# Patient Record
Sex: Female | Born: 2000 | Race: Black or African American | Hispanic: No | Marital: Single | Smoking: Never smoker
Health system: Southern US, Community
[De-identification: ages and names within clinical notes are randomized; demographics above are authoritative.]

## PROBLEM LIST (undated history)

## (undated) DIAGNOSIS — Z789 Other specified health status: Secondary | ICD-10-CM

## (undated) DIAGNOSIS — L309 Dermatitis, unspecified: Secondary | ICD-10-CM

## (undated) HISTORY — PX: HERNIA REPAIR: SHX51

---
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
## 2020-09-30 ENCOUNTER — Inpatient Hospital Stay (HOSPITAL_COMMUNITY): Payer: Medicaid Other | Admitting: Certified Registered"

## 2020-09-30 ENCOUNTER — Emergency Department (HOSPITAL_COMMUNITY): Payer: Medicaid Other

## 2020-09-30 ENCOUNTER — Inpatient Hospital Stay (HOSPITAL_COMMUNITY)
Admission: EM | Admit: 2020-09-30 | Discharge: 2020-10-03 | DRG: 473 | Disposition: A | Discharge status: Home / Self Care | Payer: Medicaid Other | Attending: Neurosurgery | Admitting: Neurosurgery

## 2020-09-30 ENCOUNTER — Encounter (HOSPITAL_COMMUNITY)
Admission: EM | Disposition: A | Discharge status: Home / Self Care | Payer: Self-pay | Source: Home / Self Care | Attending: Neurological Surgery

## 2020-09-30 ENCOUNTER — Inpatient Hospital Stay (HOSPITAL_COMMUNITY): Payer: Medicaid Other

## 2020-09-30 ENCOUNTER — Encounter (HOSPITAL_COMMUNITY): Payer: Self-pay | Admitting: Neurological Surgery

## 2020-09-30 ENCOUNTER — Other Ambulatory Visit: Payer: Self-pay

## 2020-09-30 DIAGNOSIS — Y92410 Unspecified street and highway as the place of occurrence of the external cause: Secondary | ICD-10-CM | POA: Diagnosis not present

## 2020-09-30 DIAGNOSIS — Z20822 Contact with and (suspected) exposure to covid-19: Secondary | ICD-10-CM | POA: Diagnosis present

## 2020-09-30 DIAGNOSIS — S12490A Other displaced fracture of fifth cervical vertebra, initial encounter for closed fracture: Secondary | ICD-10-CM

## 2020-09-30 DIAGNOSIS — R52 Pain, unspecified: Secondary | ICD-10-CM

## 2020-09-30 DIAGNOSIS — S129XXA Fracture of neck, unspecified, initial encounter: Secondary | ICD-10-CM | POA: Diagnosis present

## 2020-09-30 DIAGNOSIS — T1490XA Injury, unspecified, initial encounter: Secondary | ICD-10-CM

## 2020-09-30 DIAGNOSIS — Z23 Encounter for immunization: Secondary | ICD-10-CM | POA: Diagnosis not present

## 2020-09-30 DIAGNOSIS — S12390A Other displaced fracture of fourth cervical vertebra, initial encounter for closed fracture: Secondary | ICD-10-CM

## 2020-09-30 DIAGNOSIS — Z419 Encounter for procedure for purposes other than remedying health state, unspecified: Secondary | ICD-10-CM

## 2020-09-30 DIAGNOSIS — L309 Dermatitis, unspecified: Secondary | ICD-10-CM | POA: Diagnosis present

## 2020-09-30 DIAGNOSIS — M542 Cervicalgia: Secondary | ICD-10-CM | POA: Diagnosis present

## 2020-09-30 DIAGNOSIS — S12400A Unspecified displaced fracture of fifth cervical vertebra, initial encounter for closed fracture: Principal | ICD-10-CM | POA: Diagnosis present

## 2020-09-30 HISTORY — PX: ANTERIOR CERVICAL DECOMP/DISCECTOMY FUSION: SHX1161

## 2020-09-30 LAB — CBC
HCT: 40.7 % (ref 36.0–46.0)
Hemoglobin: 13.4 g/dL (ref 12.0–15.0)
MCH: 27 pg (ref 26.0–34.0)
MCHC: 32.9 g/dL (ref 30.0–36.0)
MCV: 81.9 fL (ref 80.0–100.0)
Platelets: 242 10*3/uL (ref 150–400)
RBC: 4.97 MIL/uL (ref 3.87–5.11)
RDW: 13.1 % (ref 11.5–15.5)
WBC: 8.7 10*3/uL (ref 4.0–10.5)
nRBC: 0 % (ref 0.0–0.2)

## 2020-09-30 LAB — COMPREHENSIVE METABOLIC PANEL
ALT: 12 U/L (ref 0–44)
AST: 20 U/L (ref 15–41)
Albumin: 4.4 g/dL (ref 3.5–5.0)
Alkaline Phosphatase: 50 U/L (ref 38–126)
Anion gap: 10 (ref 5–15)
BUN: <5 mg/dL — ABNORMAL LOW (ref 6–20)
CO2: 23 mmol/L (ref 22–32)
Calcium: 9.5 mg/dL (ref 8.9–10.3)
Chloride: 106 mmol/L (ref 98–111)
Creatinine, Ser: 0.88 mg/dL (ref 0.44–1.00)
GFR, Estimated: >60 mL/min (ref >60)
Glucose, Bld: 93 mg/dL (ref 70–99)
Potassium: 3.7 mmol/L (ref 3.5–5.1)
Sodium: 139 mmol/L (ref 135–145)
Total Bilirubin: 0.5 mg/dL (ref 0.3–1.2)
Total Protein: 7.1 g/dL (ref 6.5–8.1)

## 2020-09-30 LAB — I-STAT CHEM 8, ED
BUN: 4 mg/dL — ABNORMAL LOW (ref 6–20)
Calcium, Ion: 1.18 mmol/L (ref 1.15–1.40)
Chloride: 107 mmol/L (ref 98–111)
Creatinine, Ser: 0.9 mg/dL (ref 0.44–1.00)
Glucose, Bld: 90 mg/dL (ref 70–99)
HCT: 40 % (ref 36.0–46.0)
Hemoglobin: 13.6 g/dL (ref 12.0–15.0)
Potassium: 3.5 mmol/L (ref 3.5–5.1)
Sodium: 141 mmol/L (ref 135–145)
TCO2: 23 mmol/L (ref 22–32)

## 2020-09-30 LAB — I-STAT BETA HCG BLOOD, ED (MC, WL, AP ONLY): I-stat hCG, quantitative: <5 m[IU]/mL (ref <5)

## 2020-09-30 LAB — SAMPLE TO BLOOD BANK

## 2020-09-30 LAB — ETHANOL: Alcohol, Ethyl (B): 82 mg/dL — ABNORMAL HIGH (ref <10)

## 2020-09-30 LAB — PROTIME-INR
INR: 1.1 (ref 0.8–1.2)
Prothrombin Time: 13.8 seconds (ref 11.4–15.2)

## 2020-09-30 LAB — LACTIC ACID, PLASMA: Lactic Acid, Venous: 3 mmol/L (ref 0.5–1.9)

## 2020-09-30 LAB — SARS CORONAVIRUS 2 (TAT 6-24 HRS): SARS Coronavirus 2: NEGATIVE

## 2020-09-30 SURGERY — ANTERIOR CERVICAL DECOMPRESSION/DISCECTOMY FUSION 3 LEVELS
Anesthesia: General | Site: Spine Cervical

## 2020-09-30 MED ORDER — 0.9 % SODIUM CHLORIDE (POUR BTL) OPTIME
TOPICAL | Status: DC | PRN
  Administered 2020-09-30: 1000 mL

## 2020-09-30 MED ORDER — MIDAZOLAM HCL 2 MG/2ML IJ SOLN
INTRAMUSCULAR | Status: DC | PRN
  Administered 2020-09-30: 2 mg via INTRAVENOUS

## 2020-09-30 MED ORDER — FENTANYL CITRATE (PF) 100 MCG/2ML IJ SOLN
INTRAMUSCULAR | Status: AC
  Filled 2020-09-30: qty 2

## 2020-09-30 MED ORDER — PHENOL 1.4 % MT LIQD: 1.0000 | OROMUCOSAL | Status: DC | PRN

## 2020-09-30 MED ORDER — ACETAMINOPHEN 10 MG/ML IV SOLN
INTRAVENOUS | Status: AC
  Filled 2020-09-30: qty 100

## 2020-09-30 MED ORDER — HYDROMORPHONE HCL 1 MG/ML IJ SOLN: 1.0000 mg | INTRAMUSCULAR | Status: DC | PRN

## 2020-09-30 MED ORDER — MUPIROCIN 2 % EX OINT: 1.0000 "application " | TOPICAL_OINTMENT | Freq: Two times a day (BID) | CUTANEOUS | Status: DC

## 2020-09-30 MED ORDER — THROMBIN 5000 UNITS EX SOLR
OROMUCOSAL | Status: DC | PRN
  Administered 2020-09-30: 5 mL via TOPICAL

## 2020-09-30 MED ORDER — SODIUM CHLORIDE 0.9 % IV SOLN
250.0000 mL | INTRAVENOUS | Status: DC
  Administered 2020-09-30: 250 mL via INTRAVENOUS

## 2020-09-30 MED ORDER — ENSURE ENLIVE PO LIQD: 237.0000 mL | Freq: Two times a day (BID) | ORAL | Status: DC

## 2020-09-30 MED ORDER — POLYETHYLENE GLYCOL 3350 17 G PO PACK: 17.0000 g | PACK | Freq: Every day | ORAL | Status: DC | PRN

## 2020-09-30 MED ORDER — ONDANSETRON HCL 4 MG/2ML IJ SOLN: 4.0000 mg | Freq: Four times a day (QID) | INTRAMUSCULAR | Status: DC | PRN

## 2020-09-30 MED ORDER — FENTANYL CITRATE (PF) 100 MCG/2ML IJ SOLN
50.0000 ug | Freq: Once | INTRAMUSCULAR | Status: AC
Start: 2020-09-30 — End: 2020-09-30
  Administered 2020-09-30: 50 ug via INTRAVENOUS
  Filled 2020-09-30: qty 2

## 2020-09-30 MED ORDER — LIDOCAINE-EPINEPHRINE 1 %-1:100000 IJ SOLN
INTRAMUSCULAR | Status: DC | PRN
  Administered 2020-09-30: 6 mL

## 2020-09-30 MED ORDER — LIDOCAINE 2% (20 MG/ML) 5 ML SYRINGE
INTRAMUSCULAR | Status: AC
  Filled 2020-09-30: qty 5

## 2020-09-30 MED ORDER — SODIUM CHLORIDE 0.9 % IV BOLUS
500.0000 mL | Freq: Once | INTRAVENOUS | Status: AC
  Administered 2020-09-30: 500 mL via INTRAVENOUS

## 2020-09-30 MED ORDER — SODIUM CHLORIDE 0.9 % IV SOLN: INTRAVENOUS | Status: DC

## 2020-09-30 MED ORDER — SUCCINYLCHOLINE CHLORIDE 200 MG/10ML IV SOSY
PREFILLED_SYRINGE | INTRAVENOUS | Status: AC
  Filled 2020-09-30: qty 10

## 2020-09-30 MED ORDER — ONDANSETRON HCL 4 MG/2ML IJ SOLN
INTRAMUSCULAR | Status: AC
  Filled 2020-09-30: qty 2

## 2020-09-30 MED ORDER — LIDOCAINE 2% (20 MG/ML) 5 ML SYRINGE
INTRAMUSCULAR | Status: DC | PRN
  Administered 2020-09-30: 60 mg via INTRAVENOUS

## 2020-09-30 MED ORDER — SODIUM CHLORIDE 0.9 % IV BOLUS
1000.0000 mL | Freq: Once | INTRAVENOUS | Status: AC
  Administered 2020-09-30: 1000 mL via INTRAVENOUS

## 2020-09-30 MED ORDER — PROPOFOL 10 MG/ML IV BOLUS
INTRAVENOUS | Status: DC | PRN
  Administered 2020-09-30: 140 mg via INTRAVENOUS

## 2020-09-30 MED ORDER — ONDANSETRON HCL 4 MG/2ML IJ SOLN
INTRAMUSCULAR | Status: DC | PRN
  Administered 2020-09-30: 4 mg via INTRAVENOUS

## 2020-09-30 MED ORDER — OXYCODONE HCL 5 MG PO TABS: 10.0000 mg | ORAL_TABLET | ORAL | Status: DC | PRN

## 2020-09-30 MED ORDER — OXYCODONE HCL 5 MG PO TABS: 5.0000 mg | ORAL_TABLET | ORAL | Status: DC | PRN

## 2020-09-30 MED ORDER — FENTANYL CITRATE (PF) 100 MCG/2ML IJ SOLN
50.0000 ug | Freq: Once | INTRAMUSCULAR | Status: AC
  Administered 2020-09-30: 50 ug via INTRAVENOUS
  Filled 2020-09-30: qty 2

## 2020-09-30 MED ORDER — SUCCINYLCHOLINE CHLORIDE 20 MG/ML IJ SOLN
INTRAMUSCULAR | Status: DC | PRN
  Administered 2020-09-30: 100 mg via INTRAVENOUS

## 2020-09-30 MED ORDER — HYDROMORPHONE HCL 1 MG/ML IJ SOLN
1.0000 mg | INTRAMUSCULAR | Status: DC | PRN
  Administered 2020-10-01 (×2): 1 mg via INTRAVENOUS
  Filled 2020-09-30 (×3): qty 1

## 2020-09-30 MED ORDER — FENTANYL CITRATE (PF) 250 MCG/5ML IJ SOLN
INTRAMUSCULAR | Status: AC
  Filled 2020-09-30: qty 5

## 2020-09-30 MED ORDER — DOCUSATE SODIUM 100 MG PO CAPS: 100.0000 mg | ORAL_CAPSULE | Freq: Two times a day (BID) | ORAL | Status: DC

## 2020-09-30 MED ORDER — FENTANYL CITRATE (PF) 100 MCG/2ML IJ SOLN
25.0000 ug | INTRAMUSCULAR | Status: DC | PRN
  Administered 2020-09-30 (×2): 50 ug via INTRAVENOUS

## 2020-09-30 MED ORDER — MENTHOL 3 MG MT LOZG: 1.0000 | LOZENGE | OROMUCOSAL | Status: DC | PRN

## 2020-09-30 MED ORDER — PROPOFOL 10 MG/ML IV BOLUS
INTRAVENOUS | Status: AC
  Filled 2020-09-30: qty 20

## 2020-09-30 MED ORDER — CEFAZOLIN SODIUM-DEXTROSE 2-3 GM-%(50ML) IV SOLR
INTRAVENOUS | Status: DC | PRN
  Administered 2020-09-30: 2 g via INTRAVENOUS

## 2020-09-30 MED ORDER — CEFAZOLIN SODIUM-DEXTROSE 2-4 GM/100ML-% IV SOLN
2.0000 g | Freq: Three times a day (TID) | INTRAVENOUS | Status: AC
  Administered 2020-09-30 – 2020-10-01 (×2): 2 g via INTRAVENOUS
  Filled 2020-09-30 (×2): qty 100

## 2020-09-30 MED ORDER — LACTATED RINGERS IV SOLN: INTRAVENOUS | Status: DC | PRN

## 2020-09-30 MED ORDER — TETANUS-DIPHTH-ACELL PERTUSSIS 5-2.5-18.5 LF-MCG/0.5 IM SUSY
0.5000 mL | PREFILLED_SYRINGE | Freq: Once | INTRAMUSCULAR | Status: AC
  Administered 2020-09-30: 0.5 mL via INTRAMUSCULAR
  Filled 2020-09-30: qty 0.5

## 2020-09-30 MED ORDER — ACETAMINOPHEN 650 MG RE SUPP: 650.0000 mg | RECTAL | Status: DC | PRN

## 2020-09-30 MED ORDER — ACETAMINOPHEN 325 MG PO TABS
650.0000 mg | ORAL_TABLET | ORAL | Status: DC | PRN
  Administered 2020-09-30 – 2020-10-03 (×2): 650 mg via ORAL
  Filled 2020-09-30 (×2): qty 2

## 2020-09-30 MED ORDER — ACETAMINOPHEN 10 MG/ML IV SOLN
INTRAVENOUS | Status: DC | PRN
  Administered 2020-09-30: 1000 mg via INTRAVENOUS

## 2020-09-30 MED ORDER — OXYCODONE HCL 5 MG PO TABS
10.0000 mg | ORAL_TABLET | ORAL | Status: DC | PRN
  Administered 2020-09-30 – 2020-10-03 (×12): 10 mg via ORAL
  Filled 2020-09-30 (×12): qty 2

## 2020-09-30 MED ORDER — OXYCODONE HCL 5 MG PO TABS
5.0000 mg | ORAL_TABLET | ORAL | Status: DC | PRN
  Administered 2020-09-30: 5 mg via ORAL
  Filled 2020-09-30: qty 1

## 2020-09-30 MED ORDER — ACETAMINOPHEN 325 MG PO TABS: 650.0000 mg | ORAL_TABLET | ORAL | Status: DC | PRN

## 2020-09-30 MED ORDER — THROMBIN 5000 UNITS EX SOLR
CUTANEOUS | Status: AC
  Filled 2020-09-30: qty 5000

## 2020-09-30 MED ORDER — FENTANYL CITRATE (PF) 100 MCG/2ML IJ SOLN
INTRAMUSCULAR | Status: DC | PRN
  Administered 2020-09-30 (×2): 50 ug via INTRAVENOUS

## 2020-09-30 MED ORDER — ONDANSETRON HCL 4 MG PO TABS: 4.0000 mg | ORAL_TABLET | Freq: Four times a day (QID) | ORAL | Status: DC | PRN

## 2020-09-30 MED ORDER — MIDAZOLAM HCL 2 MG/2ML IJ SOLN
INTRAMUSCULAR | Status: AC
  Filled 2020-09-30: qty 2

## 2020-09-30 MED ORDER — LIDOCAINE-EPINEPHRINE 1 %-1:100000 IJ SOLN
INTRAMUSCULAR | Status: AC
  Filled 2020-09-30: qty 1

## 2020-09-30 MED ORDER — SODIUM CHLORIDE 0.9 % IV SOLN
INTRAVENOUS | Status: DC
  Filled 2020-09-30: qty 30

## 2020-09-30 MED ORDER — DEXAMETHASONE SODIUM PHOSPHATE 10 MG/ML IJ SOLN
INTRAMUSCULAR | Status: AC
  Filled 2020-09-30: qty 1

## 2020-09-30 MED ORDER — SODIUM CHLORIDE 0.9% FLUSH
3.0000 mL | Freq: Two times a day (BID) | INTRAVENOUS | Status: DC
  Administered 2020-09-30 – 2020-10-02 (×6): 3 mL via INTRAVENOUS

## 2020-09-30 MED ORDER — ROCURONIUM BROMIDE 10 MG/ML (PF) SYRINGE
PREFILLED_SYRINGE | INTRAVENOUS | Status: DC | PRN
  Administered 2020-09-30: 10 mg via INTRAVENOUS
  Administered 2020-09-30: 40 mg via INTRAVENOUS
  Administered 2020-09-30: 10 mg via INTRAVENOUS

## 2020-09-30 MED ORDER — SUGAMMADEX SODIUM 200 MG/2ML IV SOLN
INTRAVENOUS | Status: DC | PRN
  Administered 2020-09-30 (×2): 100 mg via INTRAVENOUS

## 2020-09-30 MED ORDER — PROMETHAZINE HCL 25 MG/ML IJ SOLN: 6.2500 mg | INTRAMUSCULAR | Status: DC | PRN

## 2020-09-30 MED ORDER — DOCUSATE SODIUM 100 MG PO CAPS
100.0000 mg | ORAL_CAPSULE | Freq: Two times a day (BID) | ORAL | Status: DC
  Administered 2020-09-30 – 2020-10-02 (×5): 100 mg via ORAL
  Filled 2020-09-30 (×5): qty 1

## 2020-09-30 MED ORDER — ROCURONIUM BROMIDE 10 MG/ML (PF) SYRINGE
PREFILLED_SYRINGE | INTRAVENOUS | Status: AC
  Filled 2020-09-30: qty 10

## 2020-09-30 MED ORDER — DEXAMETHASONE SODIUM PHOSPHATE 10 MG/ML IJ SOLN
INTRAMUSCULAR | Status: DC | PRN
  Administered 2020-09-30: 10 mg via INTRAVENOUS

## 2020-09-30 MED ORDER — SODIUM CHLORIDE 0.9% FLUSH: 3.0000 mL | INTRAVENOUS | Status: DC | PRN

## 2020-09-30 MED ORDER — CEFAZOLIN SODIUM 1 G IJ SOLR
INTRAMUSCULAR | Status: AC
  Filled 2020-09-30: qty 20

## 2020-09-30 MED ORDER — CYCLOBENZAPRINE HCL 10 MG PO TABS
10.0000 mg | ORAL_TABLET | Freq: Three times a day (TID) | ORAL | Status: DC | PRN
  Administered 2020-09-30 – 2020-10-02 (×4): 10 mg via ORAL
  Filled 2020-09-30 (×6): qty 1

## 2020-09-30 SURGICAL SUPPLY — 56 items
ADH SKN CLS APL DERMABOND .7 (GAUZE/BANDAGES/DRESSINGS) ×1
APL SKNCLS STERI-STRIP NONHPOA (GAUZE/BANDAGES/DRESSINGS)
BAND INSRT 18 STRL LF DISP RB (MISCELLANEOUS) ×2
BAND RUBBER #18 3X1/16 STRL (MISCELLANEOUS) ×6 IMPLANT
BENZOIN TINCTURE PRP APPL 2/3 (GAUZE/BANDAGES/DRESSINGS) IMPLANT
BLADE CLIPPER SURG (BLADE) IMPLANT
BLADE SURG 11 STRL SS (BLADE) ×3 IMPLANT
BUR MATCHSTICK NEURO 3.0 LAGG (BURR) ×3 IMPLANT
CANISTER SUCT 3000ML PPV (MISCELLANEOUS) ×3 IMPLANT
COVER WAND RF STERILE (DRAPES) ×3 IMPLANT
DECANTER SPIKE VIAL GLASS SM (MISCELLANEOUS) ×3 IMPLANT
DERMABOND ADVANCED (GAUZE/BANDAGES/DRESSINGS) ×2
DERMABOND ADVANCED .7 DNX12 (GAUZE/BANDAGES/DRESSINGS) ×1 IMPLANT
DRAPE C-ARM 42X72 X-RAY (DRAPES) ×6 IMPLANT
DRAPE HALF SHEET 40X57 (DRAPES) IMPLANT
DRAPE LAPAROTOMY 100X72 PEDS (DRAPES) ×3 IMPLANT
DRAPE MICROSCOPE LEICA (MISCELLANEOUS) ×1 IMPLANT
DURAPREP 6ML APPLICATOR 50/CS (WOUND CARE) ×3 IMPLANT
ELECT COATED BLADE 2.86 ST (ELECTRODE) ×3 IMPLANT
ELECT REM PT RETURN 9FT ADLT (ELECTROSURGICAL) ×3
ELECTRODE REM PT RTRN 9FT ADLT (ELECTROSURGICAL) ×1 IMPLANT
GAUZE 4X4 16PLY RFD (DISPOSABLE) IMPLANT
GLOVE BIOGEL PI IND STRL 7.5 (GLOVE) ×2 IMPLANT
GLOVE BIOGEL PI INDICATOR 7.5 (GLOVE) ×4
GLOVE ECLIPSE 7.5 STRL STRAW (GLOVE) ×3 IMPLANT
GLOVE EXAM NITRILE LRG STRL (GLOVE) IMPLANT
GLOVE EXAM NITRILE XL STR (GLOVE) IMPLANT
GLOVE EXAM NITRILE XS STR PU (GLOVE) IMPLANT
GOWN STRL REUS W/ TWL LRG LVL3 (GOWN DISPOSABLE) ×2 IMPLANT
GOWN STRL REUS W/ TWL XL LVL3 (GOWN DISPOSABLE) IMPLANT
GOWN STRL REUS W/TWL 2XL LVL3 (GOWN DISPOSABLE) IMPLANT
GOWN STRL REUS W/TWL LRG LVL3 (GOWN DISPOSABLE) ×6
GOWN STRL REUS W/TWL XL LVL3 (GOWN DISPOSABLE)
HEMOSTAT POWDER KIT SURGIFOAM (HEMOSTASIS) ×3 IMPLANT
KIT BASIN OR (CUSTOM PROCEDURE TRAY) ×3 IMPLANT
KIT TURNOVER KIT B (KITS) ×3 IMPLANT
NDL SPNL 18GX3.5 QUINCKE PK (NEEDLE) ×1 IMPLANT
NEEDLE HYPO 22GX1.5 SAFETY (NEEDLE) ×3 IMPLANT
NEEDLE SPNL 18GX3.5 QUINCKE PK (NEEDLE) ×3 IMPLANT
NS IRRIG 1000ML POUR BTL (IV SOLUTION) ×3 IMPLANT
PACK LAMINECTOMY NEURO (CUSTOM PROCEDURE TRAY) ×3 IMPLANT
PAD ARMBOARD 7.5X6 YLW CONV (MISCELLANEOUS) ×3 IMPLANT
PIN DISTRACTION 14MM (PIN) IMPLANT
PLATE ATLANTIS ELITE 52 (Plate) ×2 IMPLANT
SCREW SELF TAP VAR 4.0X13 (Screw) ×16 IMPLANT
SPACER BONE CORNERSTONE 5X14 (Orthopedic Implant) ×4 IMPLANT
SPACER BONE CORNERSTONE 6X14 (Orthopedic Implant) ×2 IMPLANT
SPONGE INTESTINAL PEANUT (DISPOSABLE) ×5 IMPLANT
STAPLER VISISTAT 35W (STAPLE) ×2 IMPLANT
SUT MNCRL AB 3-0 PS2 18 (SUTURE) ×3 IMPLANT
SUT VIC AB 3-0 SH 8-18 (SUTURE) ×3 IMPLANT
TAPE CLOTH 3X10 TAN LF (GAUZE/BANDAGES/DRESSINGS) ×3 IMPLANT
TOWEL GREEN STERILE (TOWEL DISPOSABLE) ×3 IMPLANT
TOWEL GREEN STERILE FF (TOWEL DISPOSABLE) ×3 IMPLANT
TRAY FOLEY MTR SLVR 16FR STAT (SET/KITS/TRAYS/PACK) ×1 IMPLANT
WATER STERILE IRR 1000ML POUR (IV SOLUTION) ×3 IMPLANT
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
## 2020-09-30 NOTE — Anesthesia Postprocedure Evaluation (Signed)
Anesthesia Post Note  Patient: Sarah Richards  Procedure(s) Performed: ANTERIOR CERVICAL DECOMPRESSION/DISCECTOMY FUSION CERVICAL THREE TO SIX (N/A Spine Cervical)     Patient location during evaluation: PACU Anesthesia Type: General Level of consciousness: awake and alert, oriented and awake Pain management: pain level controlled Vital Signs Assessment: post-procedure vital signs reviewed and stable Respiratory status: spontaneous breathing, nonlabored ventilation and respiratory function stable Cardiovascular status: blood pressure returned to baseline and stable Postop Assessment: no apparent nausea or vomiting Anesthetic complications: no   No complications documented.  Last Vitals:  Vitals:   09/30/20 1500 09/30/20 1522  BP: 106/78   Pulse: 76 72  Resp: 13 12  Temp:    SpO2: 99% 99%    Last Pain:  Vitals:   09/30/20 1522  TempSrc:   PainSc: 10-Worst pain ever                 Cecile Hearing
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
## 2020-09-30 NOTE — Anesthesia Procedure Notes (Signed)
Procedure Name: Intubation Date/Time: 09/30/2020 9:12 AM Performed by: De Nurse, CRNA Pre-anesthesia Checklist: Patient identified, Emergency Drugs available, Suction available, Patient being monitored and Timeout performed Patient Re-evaluated:Patient Re-evaluated prior to induction Oxygen Delivery Method: Ambu bag Preoxygenation: Pre-oxygenation with 100% oxygen Induction Type: IV induction and Rapid sequence Ventilation: Mask ventilation without difficulty Laryngoscope Size: Glidescope and 3 Grade View: Grade I Tube type: Subglottic suction tube Tube size: 7.0 mm Number of attempts: 1 Airway Equipment and Method: Stylet and Video-laryngoscopy Placement Confirmation: ETT inserted through vocal cords under direct vision,  breath sounds checked- equal and bilateral and CO2 detector Secured at: 21 cm Tube secured with: Tape Dental Injury: Teeth and Oropharynx as per pre-operative assessment  Difficulty Due To: Difficult Airway- due to cervical collar and Difficult Airway-  due to neck instability

## 2020-09-30 NOTE — Progress Notes (Signed)
Patient taken to pre-op by bed. Alert with no distress noted. Care taken over by [facility].

## 2020-09-30 NOTE — Transfer of Care (Signed)
Immediate Anesthesia Transfer of Care Note  Patient: Sarah Richards  Procedure(s) Performed: ANTERIOR CERVICAL DECOMPRESSION/DISCECTOMY FUSION CERVICAL THREE TO SIX (N/A Spine Cervical)  Patient Location: PACU  Anesthesia Type:General  Level of Consciousness: drowsy  Airway & Oxygen Therapy: Patient Spontanous Breathing and Patient connected to face mask oxygen  Post-op Assessment: Report given to RN  Post vital signs: Reviewed and stable  Last Vitals:  Vitals Value Taken Time  BP 123/50 09/30/20 1217  Temp    Pulse 101 09/30/20 1218  Resp 23 09/30/20 1218  SpO2 99 % 09/30/20 1218  Vitals shown include unvalidated device data.  Last Pain:  Vitals:   09/30/20 0737  TempSrc:   PainSc: 0-No pain      Patients Stated Pain Goal: 2 (09/30/20 0541)  Complications: No complications documented.

## 2020-09-30 NOTE — Brief Op Note (Signed)
09/30/2020  1:23 PM  PATIENT:  Sarah Richards  20 y.o. female  PRE-OPERATIVE DIAGNOSIS:  Cervical three-six Fracture  POST-OPERATIVE DIAGNOSIS:  Cervical three-six Fracture  PROCEDURE:  Procedure(s): ANTERIOR CERVICAL DECOMPRESSION/DISCECTOMY FUSION CERVICAL THREE TO SIX (N/A)  SURGEON:  Surgeon(s) and Role:    * Aubreyanna Dorrough, Clovis Pu, MD - Primary  PHYSICIAN ASSISTANT:   ASSISTANTS: none   ANESTHESIA:   general  EBL:  50cc  BLOOD ADMINISTERED:none  DRAINS: none   LOCAL MEDICATIONS USED:  LIDOCAINE   SPECIMEN:  No Specimen  DISPOSITION OF SPECIMEN:  N/A  COUNTS:  YES  TOURNIQUET:  * No tourniquets in log *  DICTATION: .Note written in EPIC  PLAN OF CARE: Admit to inpatient   PATIENT DISPOSITION:  PACU - hemodynamically stable.   Delay start of Pharmacological VTE agent (>24hrs) due to surgical blood loss or risk of bleeding: yes
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
## 2020-10-01 ENCOUNTER — Encounter (HOSPITAL_COMMUNITY): Payer: Self-pay | Admitting: Neurological Surgery

## 2020-10-01 MED ORDER — ADULT MULTIVITAMIN W/MINERALS CH
1.0000 | ORAL_TABLET | Freq: Every day | ORAL | Status: DC
  Administered 2020-10-01 – 2020-10-02 (×2): 1 via ORAL
  Filled 2020-10-01 (×2): qty 1
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
## 2020-10-01 NOTE — TOC CAGE-AID Note (Signed)
Transition of Care (TOC) - CAGE-AID Screening  Transition of Care (TOC) CM/SW Contact:    Vanessa Barbara, RN Phone Number: 10/01/2020, 10:20 AM   Clinical Narrative:  Patient endorses "smoking", but states she does not feel bad. She does say people have told her to cut down. She denies need for any education for substance abuse but did say she would consider and let her nurse know if she changes her mind.  CAGE-AID Screening:    Have You Ever Felt You Ought to Cut Down on Your Drinking or Drug Use?: Yes Have People Annoyed You By Critizing Your Drinking Or Drug Use?: Yes Have You Felt Bad Or Guilty About Your Drinking Or Drug Use?: No Have You Ever Had a Drink or Used Drugs First Thing In The Morning to Steady Your Nerves or to Get Rid of a Hangover?: No CAGE-AID Score: 2  Substance Abuse Education Offered: Yes
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
## 2020-10-02 LAB — NASOPHARYNGEAL CULTURE: Culture: NORMAL
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
## 2020-10-03 ENCOUNTER — Encounter (HOSPITAL_COMMUNITY): Payer: Self-pay | Admitting: Neurological Surgery

## 2020-10-03 MED ORDER — OXYCODONE-ACETAMINOPHEN 5-325 MG PO TABS: 1.0000 | ORAL_TABLET | Freq: Four times a day (QID) | ORAL | 0 refills | Status: DC | PRN

## 2020-10-03 MED ORDER — CYCLOBENZAPRINE HCL 10 MG PO TABS: 10.0000 mg | ORAL_TABLET | Freq: Three times a day (TID) | ORAL | 0 refills | Status: DC | PRN

## 2020-10-03 MED ORDER — DOCUSATE SODIUM 100 MG PO CAPS: 100.0000 mg | ORAL_CAPSULE | Freq: Two times a day (BID) | ORAL | 0 refills | Status: DC

## 2020-10-03 NOTE — Discharge Instructions (Signed)
Walk as much as possible No heavy lifting >10 lbs No excessive bending/twisting of the neck
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
## 2020-10-04 ENCOUNTER — Encounter (HOSPITAL_COMMUNITY): Payer: Self-pay | Admitting: Neurological Surgery

## 2020-10-07 ENCOUNTER — Other Ambulatory Visit: Payer: Self-pay

## 2020-10-07 ENCOUNTER — Encounter (HOSPITAL_COMMUNITY): Payer: Self-pay

## 2020-10-07 ENCOUNTER — Emergency Department (HOSPITAL_COMMUNITY)
Admission: EM | Admit: 2020-10-07 | Discharge: 2020-10-07 | Discharge status: Against Medical Advice | Payer: Medicaid Other | Attending: Emergency Medicine | Admitting: Emergency Medicine

## 2020-10-07 DIAGNOSIS — Z5321 Procedure and treatment not carried out due to patient leaving prior to being seen by health care provider: Secondary | ICD-10-CM | POA: Diagnosis not present

## 2020-10-07 DIAGNOSIS — R200 Anesthesia of skin: Secondary | ICD-10-CM | POA: Diagnosis present

## 2020-10-07 DIAGNOSIS — M549 Dorsalgia, unspecified: Secondary | ICD-10-CM | POA: Insufficient documentation
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
## 2020-10-07 NOTE — ED Notes (Signed)
Pt didn't answer when called for vitals  °

## 2020-10-22 ENCOUNTER — Ambulatory Visit: Payer: Medicaid Other | Attending: Neurological Surgery

## 2020-10-22 ENCOUNTER — Other Ambulatory Visit: Payer: Self-pay

## 2020-10-22 DIAGNOSIS — M6281 Muscle weakness (generalized): Secondary | ICD-10-CM | POA: Diagnosis present

## 2020-10-22 DIAGNOSIS — M542 Cervicalgia: Secondary | ICD-10-CM | POA: Diagnosis present
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
## 2020-10-29 ENCOUNTER — Ambulatory Visit: Payer: Medicaid Other

## 2020-10-29 ENCOUNTER — Other Ambulatory Visit: Payer: Self-pay

## 2020-10-29 DIAGNOSIS — M6281 Muscle weakness (generalized): Secondary | ICD-10-CM

## 2020-10-29 DIAGNOSIS — M542 Cervicalgia: Secondary | ICD-10-CM

## 2020-10-29 NOTE — Therapy (Signed)
[facility] Outpatient Rehabilitation [facility] [address] [place], [place], [address] Phone: [phone]   Fax:  [phone]  Physical Therapy Treatment  Patient Details  Name: Sarah Richards MRN: [id number] Date of Birth: 28-May-2000 Referring Provider (PT): Jadene Pierini, MD   Encounter Date: 10/29/2020   PT End of Session - 10/29/20 1446     Visit Number 2    Number of Visits 21    Date for PT Re-Evaluation 12/31/20    Authorization Type [place] MCD - [employer]    PT Start Time 1446    PT Stop Time 1524    PT Time Calculation (min) 38 min    Activity Tolerance Patient tolerated treatment well;No increased pain    Behavior During Therapy [facility] for tasks assessed/performed             Past Medical History:  Diagnosis Date   Eczema     Past Surgical History:  Procedure Laterality Date   ANTERIOR CERVICAL DECOMP/DISCECTOMY FUSION N/A 09/30/2020   Procedure: ANTERIOR CERVICAL DECOMPRESSION/DISCECTOMY FUSION CERVICAL THREE TO SIX;  Surgeon: Jadene Pierini, MD;  Location: [facility] OR;  Service: Neurosurgery;  Laterality: N/A;   HERNIA REPAIR      There were no vitals filed for this visit.   Subjective Assessment - 10/29/20 1446     Subjective Pt presents to PT with continued reports of neck and mid back pain. She has been compliant with her HEP with no adverse effect. She is ready to begin PT at this time.    Currently in Pain? Yes    Pain Score 6     Pain Location Neck                [facility] PT Assessment - 10/29/20 0001       Sensation   Additional Comments negative VOR cancellation, negative gaze stability                           [facility] Adult PT Treatment/Exercise - 10/29/20 0001       Exercises   Exercises Neck;Shoulder      Neck Exercises: Machines for Strengthening   UBE (Upper Arm Bike) lvl 1.0 x 4 min fwd while taking subjective      Neck Exercises: Standing   Other Standing Exercises serratus wall slide  w/ foam x 15      Neck Exercises: Seated   Other Seated Exercise median nerve glide x 10 L UE    Other Seated Exercise bicep curl 2x15 5lb L UE      Neck Exercises: Supine   Neck Retraction 10 reps    Neck Retraction Limitations x 2      Shoulder Exercises: Standing   Extension 10 reps    Extension Limitations x 2    Row 12 reps    Theraband Level (Shoulder Row) Level 4 (Blue)    Row Limitations x 2    Other Standing Exercises D2 flex with YTB L UE x 15    Other Standing Exercises wall push up 2x10      Manual Therapy   Manual therapy comments suboccipital release; positional release to L upper trap                      PT Short Term Goals - 10/22/20 1846       PT SHORT TERM GOAL #1   Title Pt will be compliant and knowledgeable  with initial HEP in order to improve carryover and comfort    Baseline initial HEP given    Time 3    Period Weeks    Status New    Target Date 11/12/20      PT SHORT TERM GOAL #2   Title Pt will self report neck pain no greater than 6/10 at worst for improve comfort and functoin    Baseline 8/10 at worst    Time 3    Period Weeks    Status New    Target Date 11/12/20               PT Long Term Goals - 10/22/20 1848       PT LONG TERM GOAL #1   Title Pt will decrease Quick DASH disability score to no greater than 20% in order to improve confidence and functional ability    Baseline 50% disability    Time 10    Period Weeks    Status New    Target Date 12/31/20      PT LONG TERM GOAL #2   Title Pt will decrease reports of neck pain to no greater than 3/10 at worst for improved functional ability    Baseline 8/10 at worst    Time 10    Period Weeks    Status New    Target Date 12/31/20      PT LONG TERM GOAL #3   Title Pt will improve grip strength in L hand to no less than 45lbs in order to improve functional ability    Baseline see flowsheet    Time 10    Period Weeks    Status New    Target Date 12/31/20                    Plan - 10/29/20 1636     Clinical Impression Statement Pt was able to complete prescribed exercises, but did have continued neck discomfort during treatment. She continues to have weakness and decreased motor control with LUE. Vestibular testing negative for nystagmus or onset of dizziness. Will continue to progress as able per POC as prescribed.    PT Treatment/Interventions ADLs/Self Care Home Management;Electrical Stimulation;Moist Heat;Gait training;Stair training;Functional mobility training;Therapeutic activities;Therapeutic exercise;Balance training;Neuromuscular re-education;Patient/family education;Manual techniques;Taping;Vasopneumatic Device;Vestibular    PT Next Visit Plan continue to progress periscapular strength and neck AROM as tolerated    PT Home Exercise Plan Access Code: [email or web address]             Patient will benefit from skilled therapeutic intervention in order to improve the following deficits and impairments:  Abnormal gait, Decreased activity tolerance, Decreased endurance, Decreased mobility, Decreased range of motion, Decreased strength, Dizziness, Impaired sensation, Impaired UE functional use, Pain  Visit Diagnosis: Cervicalgia  Muscle weakness (generalized)     Problem List Patient Active Problem List   Diagnosis Date Noted   Closed cervical spine fracture (HCC) 09/30/2020    Eloy End, PT, DPT 10/29/20 4:43 PM  [facility] Outpatient Rehabilitation [facility] [address] [place], [place], [address] Phone: [phone]   Fax:  [phone]  Name: Irena Gaydos MRN: [id number] Date of Birth: [birth year]

## 2020-10-31 ENCOUNTER — Ambulatory Visit: Payer: Medicaid Other

## 2020-11-06 ENCOUNTER — Ambulatory Visit: Payer: Medicaid Other

## 2020-11-07 ENCOUNTER — Other Ambulatory Visit: Payer: Self-pay

## 2020-11-07 ENCOUNTER — Ambulatory Visit: Payer: Medicaid Other | Attending: Neurological Surgery

## 2020-11-07 DIAGNOSIS — M6281 Muscle weakness (generalized): Secondary | ICD-10-CM | POA: Insufficient documentation

## 2020-11-07 DIAGNOSIS — M542 Cervicalgia: Secondary | ICD-10-CM | POA: Diagnosis not present
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
## 2020-11-12 ENCOUNTER — Ambulatory Visit: Payer: Medicaid Other

## 2020-11-12 ENCOUNTER — Telehealth: Payer: Self-pay
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
## 2020-11-14 ENCOUNTER — Ambulatory Visit: Payer: Medicaid Other

## 2021-05-04 NOTE — L&D Delivery Note (Signed)
Delivery Note At 1941 a viable female infant was delivered via SVD, presentation: ROA. APGAR: 8, 9; weight pending.   Placenta status: spontaneously delivered intact with gentle cord traction. Fundus firm with massage and Pitocin.   Anesthesia: epidural Lacerations: none Est. Blood Loss (mL): 100 Placenta to LD Complications none Cord ph n/a   Mom to postpartum. Baby to Couplet care / Skin to Skin.    Donette Larry, CNM 10/18/2021 8:01 PM

## 2021-05-13 ENCOUNTER — Other Ambulatory Visit: Payer: Self-pay | Admitting: Nurse Practitioner

## 2021-05-13 DIAGNOSIS — Z363 Encounter for antenatal screening for malformations: Secondary | ICD-10-CM

## 2021-05-13 DIAGNOSIS — D563 Thalassemia minor: Secondary | ICD-10-CM

## 2021-05-13 DIAGNOSIS — Z3A19 19 weeks gestation of pregnancy: Secondary | ICD-10-CM

## 2021-05-21 ENCOUNTER — Encounter: Payer: Self-pay | Admitting: *Deleted

## 2021-05-26 ENCOUNTER — Ambulatory Visit: Payer: Medicaid Other

## 2021-05-27 ENCOUNTER — Ambulatory Visit: Payer: Medicaid Other | Admitting: *Deleted

## 2021-05-27 ENCOUNTER — Other Ambulatory Visit: Payer: Self-pay

## 2021-05-27 ENCOUNTER — Ambulatory Visit: Payer: Medicaid Other | Attending: Nurse Practitioner

## 2021-05-27 ENCOUNTER — Encounter: Payer: Self-pay | Admitting: *Deleted

## 2021-05-27 ENCOUNTER — Ambulatory Visit (HOSPITAL_BASED_OUTPATIENT_CLINIC_OR_DEPARTMENT_OTHER): Payer: Medicaid Other

## 2021-05-27 VITALS — BP 115/64 | HR 69 | Ht 64.5 in

## 2021-05-27 DIAGNOSIS — D563 Thalassemia minor: Secondary | ICD-10-CM | POA: Insufficient documentation

## 2021-05-27 DIAGNOSIS — Z363 Encounter for antenatal screening for malformations: Secondary | ICD-10-CM | POA: Insufficient documentation

## 2021-05-27 DIAGNOSIS — Z3A19 19 weeks gestation of pregnancy: Secondary | ICD-10-CM | POA: Diagnosis present

## 2021-05-27 NOTE — Progress Notes (Signed)
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   EDC: 10/18/2021 LMP: 01/11/2021 Referring Provider:  Dahlia Byes, MD   EGA: [redacted]w[redacted]d Genetic Counselor: Teena Dunk, MS, CGC  OB Hx: G1P0 Date of Appointment: 05/27/2021  Accompanied by: Her reproductive partner (Terrance Horton) Face to Face Time: 30 Minutes   Previous Testing Completed: Mixtli previously completed Non-Invasive Prenatal Screening (NIPS) in this pregnancy (scanned into Epic under the Media tab). The result is low risk. This screening significantly reduces the risk that the current pregnancy has Down syndrome, Trisomy 72, Trisomy 13, Monosomy X, and Triploidy, however, the risk is not zero given the limitations of NIPS. Additionally, there are many genetic conditions that cannot be detected by NIPS.  Marvie previously completed carrier screening (scanned into Epic under the Media tab). She screened to be a silent carrier for alpha thalassemia. She screened to not be a carrier for Cystic Fibrosis (CF), Spinal Muscular Atrophy (SMA), and beta hemoglobinopathies. A negative result on carrier screening reduces the likelihood of being a carrier, however, does not entirely rule out the possibility.   Medical History:  Denies personal history of diabetes, high blood pressure, thyroid conditions, and seizures. Denies bleeding, infections, and fevers in this pregnancy. Denies using tobacco, alcohol, or street drugs in this pregnancy.   Family History:  Maternal ethnicity reported as Leisure centre manager and paternal ethnicity reported as Leisure centre manager. Denies Jewish ancestry. Patient and her partner denied a family history of consanguinity, chromosome conditions, intellectual disability, autism, birth defects, bone/skeletal disorders, blindness, deafness, neuromuscular disorders, stillbirths, early infant deaths, and 3 or more pregnancy losses for one person on the  prenatal screening questionnaire.      Genetic Counseling:   Silent Carrier for Alpha Thalassemia. Evolette is a silent carrier for alpha thalassemia (??/?-). Positive for the pathogenic alpha 3.7 deletion of the HBA2 gene. With Shelaine's alpha thalassemia screening result, we know that she has three working copies of the alpha-globin genes while the 4th alpha-globin gene is deleted. Each of Niang's children will either inherit two functional copies or one functional copy with one deletion from her. Anari is not at an increased risk to have a baby with fetal hydrops due to Hemoglobin Barts disease (--/--) regardless of her reproductive partner's carrier status. We discussed there would be a 25% risk for the current pregnancy to be affected with Hemoglobin H disease (--/?-) if her reproductive partner is found to be an alpha thalassemia carrier in the cis configuration (??/--). Clinical features of Hemoglobin H disease are highly variable and generally develop in the first years of life. The primary symptoms include moderate anemia with marked microcytosis, jaundice, and hepatosplenomegaly. Some affected individuals do not require blood transfusions while others may require occasional blood transfusions throughout their lifetime. Because Bettyann is a silent carrier for alpha thalassemia (??/?-), carrier screening for her reproductive partner is recommended to determine risk for the current pregnancy. We reviewed with Hazeline that if her partner is found to be a carrier for alpha thalassemia, given his Black/African American ancestry, it is more likely for him to be a silent carrier (??/?-) or a carrier in the trans configuration (?-/?-) as the cis configuration (??/--) has been reported very rarely in individuals with African American ancestry. If her reproductive partner is a non-carrier (??/??), a silent carrier (??/?-), or a carrier in the trans configuration (?-/?-) there would not be an increased risk for the  pregnancy to have Hemoglobin H disease. Terrance (  Lacosta's reproductive partner) reported to genetic counseling that he has already provided a saliva sample to [facility] for carrier screening for alpha thalassemia. His results are currently pending.    Patient Plan:  Proceed with: Harriett Sine (Ileta's reproductive partner) reported to genetic counseling that he has already provided a saliva sample to [facility] for carrier screening for alpha thalassemia. His results are currently pending. All questions were answered.    Thank you for sharing in the care of Emilly with [place].  Please do not hesitate to contact us if you have any questions.  Teena Dunk, MS, [facility]

## 2021-05-30 ENCOUNTER — Telehealth: Payer: Self-pay | Admitting: Genetics

## 2021-05-30 NOTE — Telephone Encounter (Signed)
Called Gavyn to discuss her partner's carrier screening results. Her partner, Avel Sensor, screened to not be a carrier for alpha thalassemia, beta hemoglobinopathies, cystic fibrosis, and spinal muscular atrophy. All questions answered.

## 2021-07-28 DIAGNOSIS — Z3403 Encounter for supervision of normal first pregnancy, third trimester: Secondary | ICD-10-CM | POA: Diagnosis not present

## 2021-07-28 DIAGNOSIS — Z87828 Personal history of other (healed) physical injury and trauma: Secondary | ICD-10-CM | POA: Diagnosis not present

## 2021-07-28 DIAGNOSIS — Z0282 Encounter for adoption services: Secondary | ICD-10-CM | POA: Diagnosis not present

## 2021-07-28 DIAGNOSIS — L209 Atopic dermatitis, unspecified: Secondary | ICD-10-CM | POA: Diagnosis not present

## 2021-07-28 DIAGNOSIS — Z0389 Encounter for observation for other suspected diseases and conditions ruled out: Secondary | ICD-10-CM | POA: Diagnosis not present

## 2021-07-28 DIAGNOSIS — N898 Other specified noninflammatory disorders of vagina: Secondary | ICD-10-CM | POA: Diagnosis not present

## 2021-07-28 DIAGNOSIS — R011 Cardiac murmur, unspecified: Secondary | ICD-10-CM | POA: Diagnosis not present

## 2021-07-28 DIAGNOSIS — B3731 Acute candidiasis of vulva and vagina: Secondary | ICD-10-CM | POA: Diagnosis not present

## 2021-07-28 DIAGNOSIS — F419 Anxiety disorder, unspecified: Secondary | ICD-10-CM | POA: Diagnosis not present

## 2021-07-28 DIAGNOSIS — D563 Thalassemia minor: Secondary | ICD-10-CM | POA: Diagnosis not present

## 2021-07-28 DIAGNOSIS — F1291 Cannabis use, unspecified, in remission: Secondary | ICD-10-CM | POA: Diagnosis not present

## 2021-08-21 ENCOUNTER — Inpatient Hospital Stay (HOSPITAL_COMMUNITY)
Admission: AD | Admit: 2021-08-21 | Discharge: 2021-08-21 | Disposition: A | Discharge status: Home / Self Care | Payer: Medicaid Other | Attending: Obstetrics and Gynecology | Admitting: Obstetrics and Gynecology

## 2021-08-21 ENCOUNTER — Encounter (HOSPITAL_COMMUNITY): Payer: Self-pay | Admitting: Obstetrics and Gynecology

## 2021-08-21 ENCOUNTER — Other Ambulatory Visit: Payer: Self-pay

## 2021-08-21 DIAGNOSIS — O26893 Other specified pregnancy related conditions, third trimester: Secondary | ICD-10-CM | POA: Insufficient documentation

## 2021-08-21 DIAGNOSIS — R1030 Lower abdominal pain, unspecified: Secondary | ICD-10-CM | POA: Diagnosis not present

## 2021-08-21 DIAGNOSIS — O368130 Decreased fetal movements, third trimester, not applicable or unspecified: Secondary | ICD-10-CM | POA: Insufficient documentation

## 2021-08-21 DIAGNOSIS — O4700 False labor before 37 completed weeks of gestation, unspecified trimester: Secondary | ICD-10-CM | POA: Diagnosis not present

## 2021-08-21 DIAGNOSIS — Z3A31 31 weeks gestation of pregnancy: Secondary | ICD-10-CM | POA: Diagnosis not present

## 2021-08-21 DIAGNOSIS — R82998 Other abnormal findings in urine: Secondary | ICD-10-CM

## 2021-08-21 HISTORY — DX: Other specified health status: Z78.9

## 2021-08-21 LAB — CBC
HCT: 35.4 % — ABNORMAL LOW (ref 36.0–46.0)
Hemoglobin: 11.7 g/dL — ABNORMAL LOW (ref 12.0–15.0)
MCH: 27.2 pg (ref 26.0–34.0)
MCHC: 33.1 g/dL (ref 30.0–36.0)
MCV: 82.3 fL (ref 80.0–100.0)
Platelets: 207 10*3/uL (ref 150–400)
RBC: 4.3 MIL/uL (ref 3.87–5.11)
RDW: 13.2 % (ref 11.5–15.5)
WBC: 8 10*3/uL (ref 4.0–10.5)
nRBC: 0 % (ref 0.0–0.2)

## 2021-08-21 LAB — GC/CHLAMYDIA PROBE AMP (~~LOC~~) NOT AT ARMC
Chlamydia: NEGATIVE
Comment: NEGATIVE
Comment: NORMAL
Neisseria Gonorrhea: NEGATIVE

## 2021-08-21 LAB — URINALYSIS, ROUTINE W REFLEX MICROSCOPIC
Bilirubin Urine: NEGATIVE
Glucose, UA: NEGATIVE mg/dL
Hgb urine dipstick: NEGATIVE
Ketones, ur: NEGATIVE mg/dL
Nitrite: NEGATIVE
Protein, ur: NEGATIVE mg/dL
Specific Gravity, Urine: 1.012 (ref 1.005–1.030)
pH: 7 (ref 5.0–8.0)

## 2021-08-21 LAB — WET PREP, GENITAL
Sperm: NONE SEEN
Trich, Wet Prep: NONE SEEN
WBC, Wet Prep HPF POC: <10 (ref <10)
Yeast Wet Prep HPF POC: NONE SEEN

## 2021-08-21 LAB — FETAL FIBRONECTIN: Fetal Fibronectin: NEGATIVE

## 2021-08-21 MED ORDER — LACTATED RINGERS IV SOLN: Freq: Once | INTRAVENOUS | Status: AC

## 2021-08-21 MED ORDER — BUTALBITAL-APAP-CAFFEINE 50-325-40 MG PO TABS
2.0000 | ORAL_TABLET | Freq: Once | ORAL | Status: AC
  Administered 2021-08-21: 2 via ORAL
  Filled 2021-08-21: qty 2

## 2021-08-21 MED ORDER — NIFEDIPINE 10 MG PO CAPS
10.0000 mg | ORAL_CAPSULE | ORAL | Status: DC | PRN
  Administered 2021-08-21 (×2): 10 mg via ORAL
  Filled 2021-08-21 (×4): qty 1

## 2021-08-21 NOTE — MAU Note (Signed)
Sarah Richards is a 21 y.o. at [redacted]w[redacted]d here in MAU reporting: "my stomachs been hurting since maybe 5 o'clock.. before it started hurting it felt like he was pinching or pulling something" states pain has "been on 10" ever since. Also reporting a HA that won't go away, but denies taking any medication for it. Unsure of safe medications to take. Denies VB or LOF. Reports no feeling baby move at all since 1700.  ? ?Onset of complaint: 1700 ? ?Vitals:  ? 08/21/21 0450  ?BP: 116/77  ?Pulse: 94  ?Resp: 17  ?Temp: 97.8 ?F (36.6 ?C)  ?SpO2: 100%  ?   ?FHT:136 ?Lab orders placed from triage: u/a  ? ?

## 2021-08-21 NOTE — MAU Provider Note (Signed)
Chief Complaint:  Abdominal Pain and Decreased Fetal Movement ? ? Event Date/Time  ? First Provider Initiated Contact with Patient 08/21/21 [phone]   ?  ?HPI: Sarah Richards is a 21 y.o. G1P0 at 2677w5dwho presents to maternity admissions reporting lower abdominal pain since 5pm.  Feels like pinching.  .Also has a headache.  ?She reports good fetal movement, denies LOF, vaginal bleeding, vaginal itching/burning, urinary symptoms, h/a, dizziness, n/v, diarrhea, constipation or fever/chills.  She denies headache, visual changes or RUQ abdominal pain. ? ?Abdominal Pain ?This is a new problem. The current episode started today. The problem occurs intermittently. The problem is unchanged. Quality: pinching. The pain does not radiate. Pertinent negatives include no constipation, diarrhea, dysuria, fever, frequency or myalgias. Nothing relieves the symptoms. Past treatments include nothing.  ? ?RN note: ?Sarah Richards is a 21 y.o. at 6477w5d here in MAU reporting: "my stomachs been hurting since maybe 5 o'clock.. before it started hurting it felt like he was pinching or pulling something" states pain has "been on 10" ever since. Also reporting a HA that won't go away, but denies taking any medication for it. Unsure of safe medications to take. Denies VB or LOF. Reports no feeling baby move at all since 1700.  ? ?Past Medical History: ?Past Medical History:  ?Diagnosis Date  ? Eczema   ? ? ?Past obstetric history: ?OB History  ?Gravida Para Term Preterm AB Living  ?1            ?SAB IAB Ectopic Multiple Live Births  ?           ?  ?# Outcome Date GA Lbr Len/2nd Weight Sex Delivery Anes PTL Lv  ?1 Current           ? ? ?Past Surgical History: ?Past Surgical History:  ?Procedure Laterality Date  ? ANTERIOR CERVICAL DECOMP/DISCECTOMY FUSION N/A 09/30/2020  ? Procedure: ANTERIOR CERVICAL DECOMPRESSION/DISCECTOMY FUSION CERVICAL THREE TO SIX;  Surgeon: Jadene Pierinistergard, Thomas A, MD;  Location: [facility] OR;  Service: Neurosurgery;  Laterality: N/A;   ? HERNIA REPAIR    ? ? ?Family History: ?Family History  ?Adopted: Yes  ? ? ?Social History: ?Social History  ? ?Tobacco Use  ? Smoking status: Never  ? Smokeless tobacco: Never  ?Vaping Use  ? Vaping Use: Never used  ?Substance Use Topics  ? Alcohol use: Not Currently  ? Drug use: Not Currently  ?  Frequency: 7.0 times per week  ?  Types: Marijuana  ?  Comment: States she "smokes" maijuana daily typically  ? ? ?Allergies: No Known Allergies ? ?Meds:  ?Medications Prior to Admission  ?Medication Sig Dispense Refill Last Dose  ? loratadine (CLARITIN) 10 MG tablet Take 10 mg by mouth daily.     ? Prenatal Vit-Fe Fumarate-FA (MULTIVITAMIN-PRENATAL) 27-0.8 MG TABS tablet Take 1 tablet by mouth daily at 12 noon.   08/21/2021  ? Prenatal Vit-Fe Fumarate-FA (PRENATAL MULTIVITAMIN) TABS tablet Take 1 tablet by mouth daily at 12 noon.   08/21/2021  ? cetirizine (ZYRTEC) 10 MG tablet Take 10 mg by mouth daily.     ? docusate sodium (COLACE) 100 MG capsule Take 1 capsule (100 mg total) by mouth 2 (two) times daily. 60 capsule 0   ? hydrocortisone 2.5 % ointment Apply 1 application topically 2 (two) times daily as needed (rash).     ? ? ?I have reviewed patient's Past Medical Hx, Surgical Hx, Family Hx, Social Hx, medications and allergies.  ? ?ROS:  ?Review of Systems  ?  Constitutional:  Negative for fever.  ?Gastrointestinal:  Positive for abdominal pain. Negative for constipation and diarrhea.  ?Genitourinary:  Negative for dysuria and frequency.  ?Musculoskeletal:  Negative for myalgias.  ?Other systems negative ? ?Physical Exam  ?Patient Vitals for the past 24 hrs: ? BP Temp Temp src Pulse Resp SpO2 Height Weight  ?08/21/21 0450 116/77 97.8 ?F (36.6 ?C) Oral 94 17 100 % 5\' 4"  (1.626 m) 66.9 kg  ? ?Constitutional: Well-developed, well-nourished female in no acute distress.  ?Cardiovascular: normal rate and rhythm ?Respiratory: normal effort ?GI: Abd soft, non-tender, gravid appropriate for gestational age.   No rebound or  guarding. ?MS: Extremities nontender, no edema, normal ROM ?Neurologic: Alert and oriented x 4.  ?GU: Neg CVAT. ? ?PELVIC EXAM: Cervix closed/long/ballotable  ? ?FHT:  Baseline 140 , moderate variability, accelerations present, no decelerations ?Contractions:  Irregular with irritability ?  ?Labs: ?Results for orders placed or performed during the hospital encounter of 08/21/21 (from the past 24 hour(s))  ?Urinalysis, Routine w reflex microscopic Urine, Clean Catch     Status: Abnormal  ? Collection Time: 08/21/21  4:57 AM  ?Result Value Ref Range  ? Color, Urine YELLOW YELLOW  ? APPearance HAZY (A) CLEAR  ? Specific Gravity, Urine 1.012 1.005 - 1.030  ? pH 7.0 5.0 - 8.0  ? Glucose, UA NEGATIVE NEGATIVE mg/dL  ? Hgb urine dipstick NEGATIVE NEGATIVE  ? Bilirubin Urine NEGATIVE NEGATIVE  ? Ketones, ur NEGATIVE NEGATIVE mg/dL  ? Protein, ur NEGATIVE NEGATIVE mg/dL  ? Nitrite NEGATIVE NEGATIVE  ? Leukocytes,Ua MODERATE (A) NEGATIVE  ? RBC / HPF 0-5 0 - 5 RBC/hpf  ? WBC, UA 11-20 0 - 5 WBC/hpf  ? Bacteria, UA RARE (A) NONE SEEN  ? Squamous Epithelial / LPF 11-20 0 - 5  ? Mucus PRESENT   ?Wet prep, genital     Status: Abnormal  ? Collection Time: 08/21/21  5:51 AM  ? Specimen: Vaginal  ?Result Value Ref Range  ? Yeast Wet Prep HPF POC NONE SEEN NONE SEEN  ? Trich, Wet Prep NONE SEEN NONE SEEN  ? Clue Cells Wet Prep HPF POC PRESENT (A) NONE SEEN  ? WBC, Wet Prep HPF POC <10 <10  ? Sperm NONE SEEN   ?Fetal fibronectin     Status: None  ? Collection Time: 08/21/21  5:51 AM  ?Result Value Ref Range  ? Fetal Fibronectin NEGATIVE NEGATIVE  ?CBC     Status: Abnormal  ? Collection Time: 08/21/21  6:21 AM  ?Result Value Ref Range  ? WBC 8.0 4.0 - 10.5 K/uL  ? RBC 4.30 3.87 - 5.11 MIL/uL  ? Hemoglobin 11.7 (L) 12.0 - 15.0 g/dL  ? HCT 35.4 (L) 36.0 - 46.0 %  ? MCV 82.3 80.0 - 100.0 fL  ? MCH 27.2 26.0 - 34.0 pg  ? MCHC 33.1 30.0 - 36.0 g/dL  ? RDW 13.2 11.5 - 15.5 %  ? Platelets 207 150 - 400 K/uL  ? nRBC 0.0 0.0 - 0.2 %  ?   ? ?Imaging:  ?No results found. ? ?MAU Course/MDM: ?I have ordered labs and reviewed results.  ?NST reviewed, reactive ?08/23/21  ?Treatments in MAU included Procardia series, PO hydration.   ?We gave 2 doses of Procardia an UCs/irritability continued but patient states the pain is less.  Now feels some low back pain ?Will infuse a liter of IV fluid since her BP is a little low, to see if that will stop UCs without using  more Procardia ?Contractions did diminish with fluids. ?Has an appt next week.  ?PTL precautions ? ?Assessment: ?Single IUP at [redacted]w[redacted]d ?Preterm Uterine Contractions ? ?Plan: ?Discharge home ?PO hydration ?PTL precautions ?Followup in office ?Urine to culture ?Encouraged to return if she develops worsening of symptoms, increase in pain, fever, or other concerning symptoms.  ? ? ? ?Wynelle Bourgeois CNM, MSN ?Certified Nurse-Midwife ?08/21/2021 ?5:08 AM ?

## 2021-08-22 LAB — CULTURE, OB URINE

## 2021-08-23 LAB — CULTURE, BETA STREP (GROUP B ONLY)

## 2021-10-15 ENCOUNTER — Inpatient Hospital Stay (HOSPITAL_COMMUNITY)
Admission: AD | Admit: 2021-10-15 | Discharge: 2021-10-16 | Disposition: A | Discharge status: Home / Self Care | Payer: Medicaid Other | Attending: Obstetrics and Gynecology | Admitting: Obstetrics and Gynecology

## 2021-10-15 DIAGNOSIS — Z3A39 39 weeks gestation of pregnancy: Secondary | ICD-10-CM | POA: Insufficient documentation

## 2021-10-15 DIAGNOSIS — O479 False labor, unspecified: Secondary | ICD-10-CM

## 2021-10-16 ENCOUNTER — Encounter (HOSPITAL_COMMUNITY): Payer: Self-pay | Admitting: Obstetrics and Gynecology

## 2021-10-16 ENCOUNTER — Other Ambulatory Visit: Payer: Self-pay | Admitting: Nurse Practitioner

## 2021-10-16 DIAGNOSIS — Z3A39 39 weeks gestation of pregnancy: Secondary | ICD-10-CM | POA: Insufficient documentation

## 2021-10-16 DIAGNOSIS — O480 Post-term pregnancy: Secondary | ICD-10-CM

## 2021-10-16 DIAGNOSIS — O479 False labor, unspecified: Secondary | ICD-10-CM

## 2021-10-16 NOTE — MAU Provider Note (Signed)
Naveh Rosenstock is a 21 y.o. G1P0 female at [redacted]w[redacted]d.  RN labor check, not seen by provider.  SVE by RN: Dilation: 2 Effacement (%):  (75) Station: -2 Exam by:: Daisy Blossom, RN  NST: FHR baseline 125 bpm, Variability: Moderate, Accelerations: Present, Decelerations: Absent = Cat 1/Reactive Toco: Irregular, every 6-11 minutes    Assessment and Plan: - Minimal change in SVE upon recheck by RN - Contracting irregularly every 6-11 minutes - Not in active labor  - Cat 1, reactive NST, VSS - Stable for discharge with return precautions  - Follow up for next OB appointment as scheduled   Worthy Rancher, MD  10/16/2021 2:48 AM

## 2021-10-16 NOTE — MAU Note (Signed)
.  Sarah Richards is a 21 y.o. at [redacted]w[redacted]d here in MAU reporting: CTX and pelvic pressure since 2300. Pt denies VB, DFM, LOF, abnormal discharge, recent intercourse, Pih s/s, and complications in the pregnancy.  Denis HSV GBS Neg SVE [facility]N P[facility] at [facility] Onset of complaint: 2300 Pain score: 6/10 Vitals:   10/16/21 0006  BP: 130/86  Pulse: 72  Resp: 18  Temp: 98.1 F (36.7 C)  SpO2: 98%     FHT:140 Lab orders placed from triage:

## 2021-10-17 ENCOUNTER — Encounter (HOSPITAL_COMMUNITY): Payer: Self-pay | Admitting: Obstetrics & Gynecology

## 2021-10-17 ENCOUNTER — Inpatient Hospital Stay (HOSPITAL_COMMUNITY)
Admission: AD | Admit: 2021-10-17 | Discharge: 2021-10-20 | DRG: 807 | Disposition: A | Discharge status: Home / Self Care | Payer: Medicaid Other | Attending: Obstetrics & Gynecology | Admitting: Obstetrics & Gynecology

## 2021-10-17 ENCOUNTER — Other Ambulatory Visit: Payer: Self-pay

## 2021-10-17 DIAGNOSIS — D563 Thalassemia minor: Secondary | ICD-10-CM | POA: Diagnosis present

## 2021-10-17 DIAGNOSIS — O139 Gestational [pregnancy-induced] hypertension without significant proteinuria, unspecified trimester: Principal | ICD-10-CM | POA: Diagnosis present

## 2021-10-17 DIAGNOSIS — O134 Gestational [pregnancy-induced] hypertension without significant proteinuria, complicating childbirth: Principal | ICD-10-CM | POA: Diagnosis present

## 2021-10-17 DIAGNOSIS — Z3A39 39 weeks gestation of pregnancy: Secondary | ICD-10-CM | POA: Diagnosis not present

## 2021-10-17 LAB — URINALYSIS, ROUTINE W REFLEX MICROSCOPIC
Bilirubin Urine: NEGATIVE
Glucose, UA: NEGATIVE mg/dL
Hgb urine dipstick: NEGATIVE
Ketones, ur: NEGATIVE mg/dL
Nitrite: NEGATIVE
Protein, ur: NEGATIVE mg/dL
Specific Gravity, Urine: 1.017 (ref 1.005–1.030)
pH: 6 (ref 5.0–8.0)

## 2021-10-17 LAB — CBC
HCT: 33.8 % — ABNORMAL LOW (ref 36.0–46.0)
Hemoglobin: 11.3 g/dL — ABNORMAL LOW (ref 12.0–15.0)
MCH: 27.4 pg (ref 26.0–34.0)
MCHC: 33.4 g/dL (ref 30.0–36.0)
MCV: 82 fL (ref 80.0–100.0)
Platelets: 216 10*3/uL (ref 150–400)
RBC: 4.12 MIL/uL (ref 3.87–5.11)
RDW: 13.6 % (ref 11.5–15.5)
WBC: 8.4 10*3/uL (ref 4.0–10.5)
nRBC: 0 % (ref 0.0–0.2)

## 2021-10-17 LAB — COMPREHENSIVE METABOLIC PANEL
ALT: 17 U/L (ref 0–44)
AST: 24 U/L (ref 15–41)
Albumin: 3.2 g/dL — ABNORMAL LOW (ref 3.5–5.0)
Alkaline Phosphatase: 174 U/L — ABNORMAL HIGH (ref 38–126)
Anion gap: 8 (ref 5–15)
BUN: 6 mg/dL (ref 6–20)
CO2: 20 mmol/L — ABNORMAL LOW (ref 22–32)
Calcium: 8.8 mg/dL — ABNORMAL LOW (ref 8.9–10.3)
Chloride: 108 mmol/L (ref 98–111)
Creatinine, Ser: 0.81 mg/dL (ref 0.44–1.00)
GFR, Estimated: >60 mL/min (ref >60)
Glucose, Bld: 85 mg/dL (ref 70–99)
Potassium: 3.9 mmol/L (ref 3.5–5.1)
Sodium: 136 mmol/L (ref 135–145)
Total Bilirubin: 0.4 mg/dL (ref 0.3–1.2)
Total Protein: 6 g/dL — ABNORMAL LOW (ref 6.5–8.1)

## 2021-10-17 LAB — TYPE AND SCREEN
ABO/RH(D): A POS
Antibody Screen: NEGATIVE

## 2021-10-17 LAB — PROTEIN / CREATININE RATIO, URINE
Creatinine, Urine: 182.51 mg/dL
Protein Creatinine Ratio: 0.08 mg/mg{Cre} (ref 0.00–0.15)
Total Protein, Urine: 14 mg/dL

## 2021-10-17 MED ORDER — OXYCODONE-ACETAMINOPHEN 5-325 MG PO TABS: 2.0000 | ORAL_TABLET | ORAL | Status: DC | PRN

## 2021-10-17 MED ORDER — ONDANSETRON HCL 4 MG/2ML IJ SOLN: 4.0000 mg | Freq: Four times a day (QID) | INTRAMUSCULAR | Status: DC | PRN

## 2021-10-17 MED ORDER — TERBUTALINE SULFATE 1 MG/ML IJ SOLN: 0.2500 mg | Freq: Once | INTRAMUSCULAR | Status: DC | PRN

## 2021-10-17 MED ORDER — ACETAMINOPHEN 325 MG PO TABS
650.0000 mg | ORAL_TABLET | ORAL | Status: DC | PRN
  Administered 2021-10-18: 650 mg via ORAL
  Filled 2021-10-17: qty 2

## 2021-10-17 MED ORDER — LACTATED RINGERS IV SOLN: INTRAVENOUS | Status: DC

## 2021-10-17 MED ORDER — MISOPROSTOL 50MCG HALF TABLET
50.0000 ug | ORAL_TABLET | ORAL | Status: DC | PRN
  Administered 2021-10-18 (×2): 50 ug via BUCCAL
  Filled 2021-10-17 (×2): qty 1

## 2021-10-17 MED ORDER — FLEET ENEMA 7-19 GM/118ML RE ENEM: 1.0000 | ENEMA | RECTAL | Status: DC | PRN

## 2021-10-17 MED ORDER — OXYCODONE-ACETAMINOPHEN 5-325 MG PO TABS: 1.0000 | ORAL_TABLET | ORAL | Status: DC | PRN

## 2021-10-17 MED ORDER — OXYTOCIN-SODIUM CHLORIDE 30-0.9 UT/500ML-% IV SOLN
2.5000 [IU]/h | INTRAVENOUS | Status: DC
  Filled 2021-10-17: qty 500

## 2021-10-17 MED ORDER — LACTATED RINGERS IV SOLN
500.0000 mL | INTRAVENOUS | Status: DC | PRN
  Administered 2021-10-17: 1000 mL via INTRAVENOUS

## 2021-10-17 MED ORDER — LIDOCAINE HCL (PF) 1 % IJ SOLN: 30.0000 mL | INTRAMUSCULAR | Status: DC | PRN

## 2021-10-17 MED ORDER — SOD CITRATE-CITRIC ACID 500-334 MG/5ML PO SOLN: 30.0000 mL | ORAL | Status: DC | PRN

## 2021-10-17 MED ORDER — OXYTOCIN BOLUS FROM INFUSION
333.0000 mL | Freq: Once | INTRAVENOUS | Status: AC
  Administered 2021-10-18: 333 mL via INTRAVENOUS

## 2021-10-17 NOTE — H&P (Signed)
OBSTETRIC ADMISSION HISTORY AND PHYSICAL  Sarah Richards is a 21 y.o. female G1P0 with IUP at [redacted]w[redacted]d by LMP presenting for IOL for new onset HTN at term. She reports +FMs, No LOF, no VB, no blurry vision, headaches or peripheral edema, and RUQ pain.  She plans on breast feeding. She request depo for birth control. She received her prenatal care at [facility]   Dating: By LMP --->  Estimated Date of Delivery: 10/18/21  Prenatal History/Complications: N/a  Past Medical History: Past Medical History:  Diagnosis Date   Eczema    Medical history non-contributory     Past Surgical History: Past Surgical History:  Procedure Laterality Date   ANTERIOR CERVICAL DECOMP/DISCECTOMY FUSION N/A 09/30/2020   Procedure: ANTERIOR CERVICAL DECOMPRESSION/DISCECTOMY FUSION CERVICAL THREE TO SIX;  Surgeon: Jadene Pierini, MD;  Location: [facility] OR;  Service: Neurosurgery;  Laterality: N/A;   HERNIA REPAIR      Obstetrical History: OB History     Gravida  1   Para      Term      Preterm      AB      Living         SAB      IAB      Ectopic      Multiple      Live Births              Social History Social History   Socioeconomic History   Marital status: Single    Spouse name: Not on file   Number of children: Not on file   Years of education: Not on file   Highest education level: Not on file  Occupational History   Not on file  Tobacco Use   Smoking status: Never   Smokeless tobacco: Never  Vaping Use   Vaping Use: Never used  Substance and Sexual Activity   Alcohol use: Not Currently   Drug use: Not Currently    Frequency: 7.0 times per week    Types: Marijuana    Comment: States she "smokes" maijuana daily typically   Sexual activity: Not Currently  Other Topics Concern   Not on file  Social History Narrative   Not on file   Social Determinants of Health   Financial Resource Strain: Not on file  Food Insecurity: Not on file  Transportation Needs: Not on  file  Physical Activity: Not on file  Stress: Not on file  Social Connections: Not on file    Family History: Family History  Adopted: Yes    Allergies: No Known Allergies  Medications Prior to Admission  Medication Sig Dispense Refill Last Dose   Prenatal Vit-Fe Fumarate-FA (PRENATAL MULTIVITAMIN) TABS tablet Take 1 tablet by mouth daily at 12 noon.   10/17/2021   cetirizine (ZYRTEC) 10 MG tablet Take 10 mg by mouth daily.      docusate sodium (COLACE) 100 MG capsule Take 1 capsule (100 mg total) by mouth 2 (two) times daily. 60 capsule 0    hydrocortisone 2.5 % ointment Apply 1 application topically 2 (two) times daily as needed (rash).      loratadine (CLARITIN) 10 MG tablet Take 10 mg by mouth daily.      Prenatal Vit-Fe Fumarate-FA (MULTIVITAMIN-PRENATAL) 27-0.8 MG TABS tablet Take 1 tablet by mouth daily at 12 noon.        Review of Systems   All systems reviewed and negative except as stated in HPI  Blood pressure (!) 143/90, pulse 60,  temperature 98.3 F (36.8 C), temperature source Oral, resp. rate 17, height 5\' 4"  (1.626 m), weight 67.5 kg, last menstrual period 01/11/2021, SpO2 99 %. General appearance: alert, cooperative, and no distress Lungs: clear to auscultation bilaterally Heart: regular rate and rhythm Abdomen: soft, non-tender; bowel sounds normal Pelvic: n/a Extremities: Homans sign is negative, no sign of DVT DTR's +2 Presentation: cephalic Fetal monitoringBaseline: 120 bpm, Variability: Good {> 6 bpm), Accelerations: Reactive, and Decelerations: Absent Uterine activityFrequency: Every 10 minutes  Pt informed that the ultrasound is considered a limited OB ultrasound and is not intended to be a complete ultrasound exam.  Patient also informed that the ultrasound is not being completed with the intent of assessing for fetal or placental anomalies or any pelvic abnormalities.  Explained that the purpose of today's ultrasound is to assess for  presentation.   Patient acknowledges the purpose of the exam and the limitations of the study.     Vertex confirmed by bedside ultrasound  Prenatal labs: SEE PRENATAL RECORDS ABO, Rh:   Antibody:   Rubella:   RPR:    HBsAg:    HIV:    GBS:   Negative  Prenatal Transfer Tool  Maternal Diabetes: No Genetic Screening: Normal Maternal Ultrasounds/Referrals: Normal Fetal Ultrasounds or other Referrals:  None Maternal Substance Abuse:  No Significant Maternal Medications:  None Significant Maternal Lab Results: Group B Strep negative  Results for orders placed or performed during the hospital encounter of 10/17/21 (from the past 24 hour(s))  Urinalysis, Routine w reflex microscopic   Collection Time: 10/17/21  9:34 PM  Result Value Ref Range   Color, Urine YELLOW YELLOW   APPearance HAZY (A) CLEAR   Specific Gravity, Urine 1.017 1.005 - 1.030   pH 6.0 5.0 - 8.0   Glucose, UA NEGATIVE NEGATIVE mg/dL   Hgb urine dipstick NEGATIVE NEGATIVE   Bilirubin Urine NEGATIVE NEGATIVE   Ketones, ur NEGATIVE NEGATIVE mg/dL   Protein, ur NEGATIVE NEGATIVE mg/dL   Nitrite NEGATIVE NEGATIVE   Leukocytes,Ua MODERATE (A) NEGATIVE   RBC / HPF 0-5 0 - 5 RBC/hpf   WBC, UA 6-10 0 - 5 WBC/hpf   Bacteria, UA RARE (A) NONE SEEN   Squamous Epithelial / LPF 6-10 0 - 5   Mucus PRESENT     Patient Active Problem List   Diagnosis Date Noted   Alpha thalassemia silent carrier 05/27/2021   Closed cervical spine fracture (HCC) 09/30/2020    Assessment/Plan:  Sarah Richards is a 21 y.o. G1P0 at [redacted]w[redacted]d here for IOL for new onset hypertension at term  #Labor: methods for IOL discussed at length. Cervix 2cm on last exam. Discussed one dose of cytotec and possibly FB if needed  -Hypertension labs pending. Asymptomatic at this time  #Pain: Options reviewed at length. #FWB: Cat 1 #ID:  GBS neg #MOF: Breast #MOC: Depo #Circ:  yes  [redacted]w[redacted]d, CNM  10/17/2021, 10:23 PM

## 2021-10-17 NOTE — MAU Provider Note (Signed)
Event Date/Time   First Provider Initiated Contact with Patient 10/17/21 2214       S: Ms. Sarah Richards is a 21 y.o. G1P0 at [redacted]w[redacted]d  who presents to MAU today complaining of elevated blood pressures at home. She denies vaginal bleeding. She denies LOF. She reports normal fetal movement.    O: BP (!) 144/80   Pulse 62   Temp 98.3 F (36.8 C) (Oral)   Resp 17   Ht 5\' 4"  (1.626 m)   Wt 67.5 kg   LMP 01/11/2021   SpO2 99%   BMI 25.54 kg/m  GENERAL: Well-developed, well-nourished female in no acute distress.  HEAD: Normocephalic, atraumatic.  CHEST: Normal effort of breathing, regular heart rate ABDOMEN: Soft, nontender, gravid  Fetal Monitoring: Baseline: 120 Variability: moderate Accelerations: 15x15 Decelerations: none  Contractions: q10   A: SIUP at [redacted]w[redacted]d  New onset HTN at term  CNM discussed recommendation for IOL and patient agreeable to plan of care  P: -Admit to labor and delivery -Report called to labor team  [redacted]w[redacted]d, CNM 10/17/2021 10:14 PM

## 2021-10-17 NOTE — MAU Note (Signed)
.  Sarah Richards is a 21 y.o. at [redacted]w[redacted]d here in MAU reporting: being told to come in by [facility] triage nurse after having a HBP of 149/96 at home. Denies HA, visual changes, RUQ pain, or abnormal swelling. Denies VB or LOF. Reports good FM. Has not had any ctx since being discharged from MAU last night. She reports that she is not in any pain right now, but  "just that my pelvis is tight".   Onset of complaint: 2030 Pain score: 0/10 Vitals:   10/17/21 2130 10/17/21 2131  BP:  140/90  Pulse: 67 68  Resp: 17   Temp: 98.3 F (36.8 C)   SpO2: 100%      FHT:130 Lab orders placed from triage:  UA

## 2021-10-18 ENCOUNTER — Inpatient Hospital Stay (HOSPITAL_COMMUNITY): Payer: Medicaid Other | Admitting: Anesthesiology

## 2021-10-18 ENCOUNTER — Encounter (HOSPITAL_COMMUNITY): Payer: Self-pay | Admitting: Obstetrics & Gynecology

## 2021-10-18 DIAGNOSIS — O134 Gestational [pregnancy-induced] hypertension without significant proteinuria, complicating childbirth: Secondary | ICD-10-CM

## 2021-10-18 DIAGNOSIS — Z3A39 39 weeks gestation of pregnancy: Secondary | ICD-10-CM

## 2021-10-18 LAB — CBC
HCT: 35.3 % — ABNORMAL LOW (ref 36.0–46.0)
Hemoglobin: 11.5 g/dL — ABNORMAL LOW (ref 12.0–15.0)
MCH: 26.7 pg (ref 26.0–34.0)
MCHC: 32.6 g/dL (ref 30.0–36.0)
MCV: 81.9 fL (ref 80.0–100.0)
Platelets: 206 10*3/uL (ref 150–400)
RBC: 4.31 MIL/uL (ref 3.87–5.11)
RDW: 13.4 % (ref 11.5–15.5)
WBC: 8.4 10*3/uL (ref 4.0–10.5)
nRBC: 0 % (ref 0.0–0.2)

## 2021-10-18 LAB — RAPID URINE DRUG SCREEN, HOSP PERFORMED
Amphetamines: NOT DETECTED
Barbiturates: NOT DETECTED
Benzodiazepines: NOT DETECTED
Cocaine: NOT DETECTED
Opiates: NOT DETECTED
Tetrahydrocannabinol: NOT DETECTED

## 2021-10-18 LAB — RPR: RPR Ser Ql: NONREACTIVE

## 2021-10-18 MED ORDER — ONDANSETRON HCL 4 MG/2ML IJ SOLN: 4.0000 mg | INTRAMUSCULAR | Status: DC | PRN

## 2021-10-18 MED ORDER — ACETAMINOPHEN 325 MG PO TABS
650.0000 mg | ORAL_TABLET | ORAL | Status: DC | PRN
  Administered 2021-10-19 (×2): 650 mg via ORAL
  Filled 2021-10-18 (×2): qty 2

## 2021-10-18 MED ORDER — FENTANYL CITRATE (PF) 100 MCG/2ML IJ SOLN
100.0000 ug | Freq: Once | INTRAMUSCULAR | Status: AC
  Administered 2021-10-18: 100 ug via INTRAVENOUS
  Filled 2021-10-18: qty 2

## 2021-10-18 MED ORDER — MEASLES, MUMPS & RUBELLA VAC IJ SOLR: 0.5000 mL | Freq: Once | INTRAMUSCULAR | Status: DC

## 2021-10-18 MED ORDER — ONDANSETRON HCL 4 MG PO TABS: 4.0000 mg | ORAL_TABLET | ORAL | Status: DC | PRN

## 2021-10-18 MED ORDER — DIBUCAINE (PERIANAL) 1 % EX OINT: 1.0000 | TOPICAL_OINTMENT | CUTANEOUS | Status: DC | PRN

## 2021-10-18 MED ORDER — BENZOCAINE-MENTHOL 20-0.5 % EX AERO: 1.0000 | INHALATION_SPRAY | CUTANEOUS | Status: DC | PRN

## 2021-10-18 MED ORDER — SENNOSIDES-DOCUSATE SODIUM 8.6-50 MG PO TABS
2.0000 | ORAL_TABLET | ORAL | Status: DC
  Administered 2021-10-18 – 2021-10-20 (×2): 2 via ORAL
  Filled 2021-10-18 (×3): qty 2

## 2021-10-18 MED ORDER — IBUPROFEN 600 MG PO TABS
600.0000 mg | ORAL_TABLET | Freq: Four times a day (QID) | ORAL | Status: DC
  Administered 2021-10-18 – 2021-10-20 (×7): 600 mg via ORAL
  Filled 2021-10-18 (×7): qty 1

## 2021-10-18 MED ORDER — WITCH HAZEL-GLYCERIN EX PADS: 1.0000 | MEDICATED_PAD | CUTANEOUS | Status: DC | PRN

## 2021-10-18 MED ORDER — TETANUS-DIPHTH-ACELL PERTUSSIS 5-2.5-18.5 LF-MCG/0.5 IM SUSY: 0.5000 mL | PREFILLED_SYRINGE | Freq: Once | INTRAMUSCULAR | Status: DC

## 2021-10-18 MED ORDER — FENTANYL CITRATE (PF) 100 MCG/2ML IJ SOLN: 100.0000 ug | INTRAMUSCULAR | Status: DC | PRN

## 2021-10-18 MED ORDER — LACTATED RINGERS IV SOLN
500.0000 mL | Freq: Once | INTRAVENOUS | Status: AC
  Administered 2021-10-18: 500 mL via INTRAVENOUS

## 2021-10-18 MED ORDER — PHENYLEPHRINE 80 MCG/ML (10ML) SYRINGE FOR IV PUSH (FOR BLOOD PRESSURE SUPPORT)
80.0000 ug | PREFILLED_SYRINGE | INTRAVENOUS | Status: DC | PRN
Start: 2021-10-18 — End: 2021-10-18
  Filled 2021-10-18: qty 10

## 2021-10-18 MED ORDER — SIMETHICONE 80 MG PO CHEW: 80.0000 mg | CHEWABLE_TABLET | ORAL | Status: DC | PRN

## 2021-10-18 MED ORDER — EPHEDRINE 5 MG/ML INJ
10.0000 mg | INTRAVENOUS | Status: DC | PRN
Start: 2021-10-18 — End: 2021-10-18

## 2021-10-18 MED ORDER — LIDOCAINE HCL (PF) 1 % IJ SOLN
INTRAMUSCULAR | Status: DC | PRN
  Administered 2021-10-18 (×2): 5 mL via EPIDURAL

## 2021-10-18 MED ORDER — DIPHENHYDRAMINE HCL 50 MG/ML IJ SOLN: 12.5000 mg | INTRAMUSCULAR | Status: DC | PRN

## 2021-10-18 MED ORDER — COCONUT OIL OIL: 1.0000 | TOPICAL_OIL | Status: DC | PRN

## 2021-10-18 MED ORDER — PHENYLEPHRINE 80 MCG/ML (10ML) SYRINGE FOR IV PUSH (FOR BLOOD PRESSURE SUPPORT)
80.0000 ug | PREFILLED_SYRINGE | INTRAVENOUS | Status: DC | PRN
Start: 2021-10-18 — End: 2021-10-18

## 2021-10-18 MED ORDER — FENTANYL-BUPIVACAINE-NACL 0.5-0.125-0.9 MG/250ML-% EP SOLN
12.0000 mL/h | EPIDURAL | Status: DC | PRN
  Administered 2021-10-18: 12 mL/h via EPIDURAL
  Filled 2021-10-18 (×2): qty 250

## 2021-10-18 MED ORDER — PRENATAL MULTIVITAMIN CH
1.0000 | ORAL_TABLET | Freq: Every day | ORAL | Status: DC
  Administered 2021-10-19 – 2021-10-20 (×2): 1 via ORAL
  Filled 2021-10-18 (×2): qty 1

## 2021-10-18 MED ORDER — DIPHENHYDRAMINE HCL 25 MG PO CAPS: 25.0000 mg | ORAL_CAPSULE | Freq: Four times a day (QID) | ORAL | Status: DC | PRN

## 2021-10-18 MED ORDER — EPHEDRINE 5 MG/ML INJ: 10.0000 mg | INTRAVENOUS | Status: DC | PRN

## 2021-10-18 NOTE — Discharge Summary (Signed)
Postpartum Discharge Summary  Date of Service updated 10/20/21      Patient Name: Sarah Richards DOB: [birth year] MRN: [id number]  Date of admission: 10/17/2021 Delivery date:10/18/2021  Delivering provider: Donette Larry  Date of discharge: 10/20/2021  Admitting diagnosis: Gestational hypertension [O13.9] Intrauterine pregnancy: [redacted]w[redacted]d     Secondary diagnosis:  Principal Problem:   Gestational hypertension Active Problems:   SVD (spontaneous vaginal delivery)  Additional problems: none    Discharge diagnosis: Term Pregnancy Delivered and Gestational Hypertension                                              Post partum procedures: Depo  Augmentation: AROM and Cytotec Complications: None  Hospital course: Induction of Labor With Vaginal Delivery   21 y.o. yo G1P0 at [redacted]w[redacted]d was admitted to the hospital 10/17/2021 for induction of labor.  Indication for induction: Gestational hypertension.  Patient had an uncomplicated labor course as follows: Membrane Rupture Time/Date: 3:14 PM ,10/18/2021   Delivery Method:Vaginal, Spontaneous  Episiotomy: None  Lacerations:  None  Details of delivery can be found in separate delivery note.  Patient had a routine postpartum course. Patient is discharged home 10/20/21.  Newborn Data: Birth date:10/18/2021  Birth time:7:41 PM  Gender:Female  Living status:Living  Apgars:8 ,9  Weight:3090 g   Magnesium Sulfate received: No BMZ received: No Rhophylac:N/A MMR:N/A T-DaP:Given prenatally Flu: N/A Transfusion:No  Physical exam  Vitals:   10/19/21 1600 10/19/21 2028 10/20/21 0523 10/20/21 1035  BP: 140/82 130/72 127/76 133/71  Pulse: (!) 54 75 82 89  Resp: 18 18 16    Temp: 98.4 F (36.9 C) 98.5 F (36.9 C) 98.1 F (36.7 C)   TempSrc:  Oral Oral   SpO2:  100% 98%   Weight:      Height:       General: alert, cooperative, and no distress Lochia: appropriate Uterine Fundus: firm Incision: N/A DVT Evaluation: No evidence of DVT  seen on physical exam. No significant calf/ankle edema. Labs: Lab Results  Component Value Date   WBC 8.4 10/18/2021   HGB 11.5 (L) 10/18/2021   HCT 35.3 (L) 10/18/2021   MCV 81.9 10/18/2021   PLT 206 10/18/2021      Latest Ref Rng & Units 10/19/2021   11:49 AM  CMP  Glucose 70 - 99 mg/dL 81   BUN 6 - 20 mg/dL 5   Creatinine 5.62 - 1.30 mg/dL 8.65   Sodium 784 - 696 mmol/L 137   Potassium 3.5 - 5.1 mmol/L 3.9   Chloride 98 - 111 mmol/L 107   CO2 22 - 32 mmol/L 23   Calcium 8.9 - 10.3 mg/dL 8.9   Total Protein 6.5 - 8.1 g/dL 6.0   Total Bilirubin 0.3 - 1.2 mg/dL 0.5   Alkaline Phos 38 - 126 U/L 150   AST 15 - 41 U/L 28   ALT 0 - 44 U/L 18    Edinburgh Score:    10/19/2021    8:57 PM  Edinburgh Postnatal Depression Scale Screening Tool  I have been able to laugh and see the funny side of things. 0  I have looked forward with enjoyment to things. 0  I have blamed myself unnecessarily when things went wrong. 1  I have been anxious or worried for no good reason. 2  I have felt scared or panicky for no  good reason. 0  Things have been getting on top of me. 1  I have been so unhappy that I have had difficulty sleeping. 0  I have felt sad or miserable. 1  I have been so unhappy that I have been crying. 1  The thought of harming myself has occurred to me. 0  Edinburgh Postnatal Depression Scale Total 6     After visit meds:  Allergies as of 10/20/2021   No Known Allergies      Medication List     STOP taking these medications    cetirizine 10 MG tablet Commonly known as: ZYRTEC   docusate sodium 100 MG capsule Commonly known as: COLACE   hydrocortisone 2.5 % ointment   loratadine 10 MG tablet Commonly known as: CLARITIN   multivitamin-prenatal 27-0.8 MG Tabs tablet   prenatal multivitamin Tabs tablet       TAKE these medications    furosemide 20 MG tablet Commonly known as: LASIX Take 1 tablet (20 mg total) by mouth daily. Start taking on: October 21, 2021   ibuprofen 600 MG tablet Commonly known as: ADVIL Take 1 tablet (600 mg total) by mouth every 6 (six) hours.   NIFEdipine 30 MG 24 hr tablet Commonly known as: ADALAT CC Take 1 tablet (30 mg total) by mouth daily. Start taking on: October 21, 2021         Discharge home in stable condition Infant Feeding: Breast Infant Disposition:home with mother Discharge instruction: per After Visit Summary and Postpartum booklet. Activity: Advance as tolerated. Pelvic rest for 6 weeks.  Diet: routine diet Future Appointments: [facility] in 6 weeks Follow up Visit: BP check in 1 week at [facility]- message sent  10/20/2021 Claudette Head, CNM

## 2021-10-18 NOTE — Progress Notes (Signed)
Labor Progress Note Sarah Richards is a 21 y.o. G1P0 at [redacted]w[redacted]d presented for IOL for gHTN  S:  Comfortable with epidural. No c/o.  O:  BP 117/69   Pulse (!) 56   Temp 97.7 F (36.5 C) (Oral)   Resp 16   Ht 5\' 4"  (1.626 m)   Wt 67.5 kg   LMP 01/11/2021   SpO2 99%   BMI 25.54 kg/m  EFM: baseline 120 bpm/ mod variability/ no accels/ occ late decels  Toco/IUPC: 2-6 SVE: Dilation: 5 Effacement (%): 90 Cervical Position: Posterior Station: -1 Presentation: Vertex Exam by:: [name], CNM AROM-clear, bloody, small  A/P: 21 y.o. G1P0 [redacted]w[redacted]d  1. Labor: active, slow progress 2. FWB: Cat II 3. Pain: epidural 4. gHTN-stable  Consented for AROM, small amt clear bloody fluid. Anticipate labor progress and SVD.  [redacted]w[redacted]d, CNM 3:19 PM

## 2021-10-18 NOTE — Lactation Note (Signed)
This note was copied from a baby's chart. Lactation Consultation Note Mom trying to latch baby when LC came into rm. LC assisted and positioned baby at the breast. Baby latched well although pops off and on occasionally. Has nasal congestion. Respirations a little fast. RN notified. Mom denies painful latch.  Will f/u w/mom on MBU.  Patient Name: Sarah Richards [id number] Date: 10/18/2021 Reason for consult: L&D Initial assessment Age:21 hours  Maternal Data Does the patient have breastfeeding experience prior to this delivery?: No  Feeding    LATCH Score Latch: Repeated attempts needed to sustain latch, nipple held in mouth throughout feeding, stimulation needed to elicit sucking reflex.  Audible Swallowing: None  Type of Nipple: Everted at rest and after stimulation  Comfort (Breast/Nipple): Soft / non-tender  Hold (Positioning): Assistance needed to correctly position infant at breast and maintain latch.  LATCH Score: 6   Lactation Tools Discussed/Used    Interventions Interventions: Adjust position;Assisted with latch;Support pillows;Skin to skin;Breast compression  Discharge    Consult Status Consult Status: Follow-up from L&D Date: 10/19/21 Follow-up type: In-patient    Charyl Dancer 10/18/2021, 8:32 PM

## 2021-10-18 NOTE — Anesthesia Procedure Notes (Signed)
Epidural Patient location during procedure: OB Start time: 10/18/2021 11:34 AM End time: 10/18/2021 11:42 AM  Staffing Anesthesiologist: Mal Amabile, MD Performed: anesthesiologist   Preanesthetic Checklist Completed: patient identified, IV checked, site marked, risks and benefits discussed, surgical consent, monitors and equipment checked, pre-op evaluation and timeout performed  Epidural Patient position: sitting Prep: DuraPrep and site prepped and draped Patient monitoring: continuous pulse ox and blood pressure Approach: midline Location: L3-L4 Injection technique: LOR air  Needle:  Needle type: Tuohy  Needle gauge: 17 G Needle length: 9 cm and 9 Needle insertion depth: 6 cm Catheter type: closed end flexible Catheter size: 19 Gauge Catheter at skin depth: 11 cm Test dose: negative and Other  Assessment Events: blood not aspirated, injection not painful, no injection resistance, no paresthesia and negative IV test  Additional Notes Patient identified. Risks and benefits discussed including failed block, incomplete  Pain control, post dural puncture headache, nerve damage, paralysis, blood pressure Changes, nausea, vomiting, reactions to medications-both toxic and allergic and post Partum back pain. All questions were answered. Patient expressed understanding and wished to proceed. Sterile technique was used throughout procedure. Epidural site was Dressed with sterile barrier dressing. No paresthesias, signs of intravascular injection Or signs of intrathecal spread were encountered.  Patient was more comfortable after the epidural was dosed. Please see RN's note for documentation of vital signs and FHR which are stable.

## 2021-10-18 NOTE — Progress Notes (Signed)
Labor Progress Note Jennye Melikian is a 21 y.o. G1P0 at [redacted]w[redacted]d presented for IOL for gHTN  S:  Feeling ctx. Currently in hands and knees breathing with ctx.  O:  BP (!) 145/92   Pulse (!) 50   Temp 98.1 F (36.7 C) (Oral)   Resp 16   Ht 5\' 4"  (1.626 m)   Wt 67.5 kg   LMP 01/11/2021   SpO2 99%   BMI 25.54 kg/m  EFM: baseline 125 bpm/ mod variability/ + accels/ no decels  Toco/IUPC: 3-5 SVE: Dilation: 4 Effacement (%): 80 Cervical Position: Posterior Station: -2 Presentation: Vertex Exam by:: Yilin Weedon, CNM   A/P: 21 y.o. G1P0 [redacted]w[redacted]d  1. Labor: early active, good progress 2. FWB: Cat I 3. Pain: analgesia/anesthesia/NO prn 4. gHTN: stable  Plan for IV pain meds and/or epidural. Anticipate labor progress and SVD.  [redacted]w[redacted]d, CNM 9:47 AM

## 2021-10-18 NOTE — Progress Notes (Signed)
Labor Progress Note Sarah Richards is a 21 y.o. G1P0 at [redacted]w[redacted]d who presented for IOL due to gHTN.  S: Resting, doing well. No concerns.   O:  BP (!) 133/95 (BP Location: Right Arm)   Pulse 62   Temp 98.1 F (36.7 C) (Oral)   Resp 18   Ht 5\' 4"  (1.626 m)   Wt 67.5 kg   LMP 01/11/2021   SpO2 99%   BMI 25.54 kg/m   EFM: Baseline 125 bpm, moderate variability, + accels, no decels   CVE: Dilation: 1.5 Effacement (%): 30 Cervical Position: Posterior Station: -3 Presentation: Vertex Exam by:: [email or web address] RN  A&P: 21 y.o. G1P0 [redacted]w[redacted]d   #Labor: SVE similar to prior. Additional dose of Cytotec given. Offered foley balloon placement and patient declined for now. Will reassess on next exam.  #Pain: PRN; coping well  #FWB: Cat 1  #GBS negative  #gHTN: BP mild range. No symptoms. PIH labs within normal limits. Will continue to monitor.   [redacted]w[redacted]d, MD 4:52 AM

## 2021-10-18 NOTE — Anesthesia Preprocedure Evaluation (Signed)
Anesthesia Evaluation  Patient identified by MRN, date of birth, ID band Patient awake    Reviewed: Allergy & Precautions, Patient's Chart, lab work & pertinent test results  Airway Mallampati: III  TM Distance: >3 FB Neck ROM: Limited  Mouth opening: Pediatric Airway  Dental  (+) Teeth Intact, Dental Advisory Given   Pulmonary neg pulmonary ROS,    Pulmonary exam normal breath sounds clear to auscultation       Cardiovascular hypertension, Normal cardiovascular exam Rhythm:Regular Rate:Normal  Gestational HTN   Neuro/Psych negative neurological ROS  negative psych ROS   GI/Hepatic Neg liver ROS, GERD  ,  Endo/Other  negative endocrine ROS  Renal/GU negative Renal ROS  negative genitourinary   Musculoskeletal Hx/o cervical fusion   Abdominal   Peds  Hematology  (+) Blood dyscrasia, anemia ,   Anesthesia Other Findings   Reproductive/Obstetrics (+) Pregnancy Gestational HTN                             Anesthesia Physical Anesthesia Plan  ASA: 2  Anesthesia Plan: Epidural   Post-op Pain Management:    Induction:   PONV Risk Score and Plan: Treatment may vary due to age or medical condition  Airway Management Planned: Natural Airway  Additional Equipment:   Intra-op Plan:   Post-operative Plan:   Informed Consent: I have reviewed the patients History and Physical, chart, labs and discussed the procedure including the risks, benefits and alternatives for the proposed anesthesia with the patient or authorized representative who has indicated his/her understanding and acceptance.       Plan Discussed with: Anesthesiologist  Anesthesia Plan Comments:         Anesthesia Quick Evaluation

## 2021-10-18 NOTE — Progress Notes (Signed)
Labor Progress Note Sarah Richards is a 21 y.o. G1P0 at [redacted]w[redacted]d presented for IOL for gHTN  S:  Comfortable with epidural, "stomach feels upset".  O:  BP 136/82   Pulse (!) 49   Temp 97.8 F (36.6 C) (Oral)   Resp 16   Ht 5\' 4"  (1.626 m)   Wt 67.5 kg   LMP 01/11/2021   SpO2 99%   BMI 25.54 kg/m  EFM: baseline 115 bpm/ mod variability/ no accels/ occ late decels  Toco/IUPC: 2-4 SVE: Dilation: 10 Dilation Complete Date: 10/18/21 Dilation Complete Time: 1859 Effacement (%): 90 Cervical Position: Posterior Station: Plus 2 Presentation: Vertex Exam by:: Lalonnie Shaffer, CNM   A/P: 21 y.o. G1P0 [redacted]w[redacted]d  1. Labor: second stage 2. FWB: Cat II 3. Pain: epidural 4. gHTN: stable  Will start pushing. Anticipate SVD.  [redacted]w[redacted]d, CNM 7:02 PM

## 2021-10-19 LAB — COMPREHENSIVE METABOLIC PANEL
ALT: 18 U/L (ref 0–44)
AST: 28 U/L (ref 15–41)
Albumin: 3 g/dL — ABNORMAL LOW (ref 3.5–5.0)
Alkaline Phosphatase: 150 U/L — ABNORMAL HIGH (ref 38–126)
Anion gap: 7 (ref 5–15)
BUN: 5 mg/dL — ABNORMAL LOW (ref 6–20)
CO2: 23 mmol/L (ref 22–32)
Calcium: 8.9 mg/dL (ref 8.9–10.3)
Chloride: 107 mmol/L (ref 98–111)
Creatinine, Ser: 0.77 mg/dL (ref 0.44–1.00)
GFR, Estimated: >60 mL/min (ref >60)
Glucose, Bld: 81 mg/dL (ref 70–99)
Potassium: 3.9 mmol/L (ref 3.5–5.1)
Sodium: 137 mmol/L (ref 135–145)
Total Bilirubin: 0.5 mg/dL (ref 0.3–1.2)
Total Protein: 6 g/dL — ABNORMAL LOW (ref 6.5–8.1)

## 2021-10-19 MED ORDER — MEDROXYPROGESTERONE ACETATE 150 MG/ML IM SUSP
150.0000 mg | Freq: Once | INTRAMUSCULAR | Status: AC
  Administered 2021-10-20: 150 mg via INTRAMUSCULAR
  Filled 2021-10-19: qty 1

## 2021-10-19 MED ORDER — FUROSEMIDE 20 MG PO TABS
20.0000 mg | ORAL_TABLET | Freq: Every day | ORAL | Status: DC
  Administered 2021-10-19 – 2021-10-20 (×2): 20 mg via ORAL
  Filled 2021-10-19 (×2): qty 1

## 2021-10-19 MED ORDER — NIFEDIPINE ER OSMOTIC RELEASE 30 MG PO TB24
30.0000 mg | ORAL_TABLET | Freq: Every day | ORAL | Status: DC
  Administered 2021-10-19 – 2021-10-20 (×2): 30 mg via ORAL
  Filled 2021-10-19 (×2): qty 1

## 2021-10-19 NOTE — Lactation Note (Signed)
This note was copied from a baby's chart. Lactation Consultation Note Mom stated baby has BF well. Mom doesn't have any questions at this time. Mom was awake and FOB holding baby. Mom stated she was tired. Encouraged mom to call for assistance w/latching if needed. Mom will need to be seen again today when mom is ready to BF and not so tired.  Patient Name: Sarah Richards [id number] Date: 10/19/2021 Reason for consult: Initial assessment;Primapara;Term Age:67 hours  Maternal Data Has patient been taught Hand Expression?: No Does the patient have breastfeeding experience prior to this delivery?: No  Feeding    LATCH Score Latch: Repeated attempts needed to sustain latch, nipple held in mouth throughout feeding, stimulation needed to elicit sucking reflex.  Audible Swallowing: None  Type of Nipple: Everted at rest and after stimulation  Comfort (Breast/Nipple): Soft / non-tender  Hold (Positioning): Assistance needed to correctly position infant at breast and maintain latch.  LATCH Score: 6   Lactation Tools Discussed/Used    Interventions Interventions: Assisted with latch;Adjust position;Support pillows;Skin to skin  Discharge    Consult Status Consult Status: Follow-up Date: 10/19/21 Follow-up type: In-patient    Charyl Dancer 10/19/2021, 12:10 AM

## 2021-10-19 NOTE — Lactation Note (Signed)
This note was copied from a baby's chart. Lactation Consultation Note  Patient Name: Sarah Richards [id number] Date: 10/19/2021 Reason for consult: Follow-up assessment;Primapara;1st time breastfeeding;Term Age:21 hours   P1 mother whose infant is now 76 hours old.  This is a term baby at 40+0 weeks.  Mother's current feeding preference is breast/formula.  Baby "[name]" was asleep STS on mother's chest after having received his bath.  Mother has been breast feeding with an occasional "biting" latch.  Discussed how to latch effectively and suggested mother call for latch assistance with the next feeding.  Reviewed breast feeding basics.  Provided coconut oil per mother's request.  Father present.   Maternal Data Has patient been taught Hand Expression?: Yes Does the patient have breastfeeding experience prior to this delivery?: No  Feeding Mother's Current Feeding Choice: Breast Milk and Formula  LATCH Score Latch: Repeated attempts needed to sustain latch, nipple held in mouth throughout feeding, stimulation needed to elicit sucking reflex.  Audible Swallowing: None  Type of Nipple: Everted at rest and after stimulation  Comfort (Breast/Nipple): Soft / non-tender  Hold (Positioning): Assistance needed to correctly position infant at breast and maintain latch.  LATCH Score: 6   Lactation Tools Discussed/Used    Interventions Interventions: Breast feeding basics reviewed;Education  Discharge Pump: Personal  Consult Status Consult Status: Follow-up Date: 10/20/21 Follow-up type: In-patient    Eyad Rochford R Kaede Clendenen 10/19/2021, 10:49 AM

## 2021-10-19 NOTE — Progress Notes (Signed)
Post Partum Day 1 Subjective: no complaints, up ad lib, voiding, tolerating PO, and denies HA, vision changes and epigastric pain.  Objective: Blood pressure 140/82, pulse (!) 54, temperature 98.4 F (36.9 C), resp. rate 18, height 5\' 4"  (1.626 m), weight 67.5 kg, last menstrual period 01/11/2021, SpO2 100 %, unknown if currently breastfeeding. Patient Vitals for the past 24 hrs:  BP Temp Temp src Pulse Resp SpO2  10/19/21 1600 140/82 98.4 F (36.9 C) -- (!) 54 18 --  10/19/21 0920 (!) 152/86 98.6 F (37 C) -- (!) 48 20 --  10/19/21 0731 (!) 155/80 -- -- -- -- --  10/19/21 0628 (!) 151/95 98.4 F (36.9 C) Oral (!) 50 16 100 %  10/19/21 0314 115/66 98.2 F (36.8 C) Oral (!) 57 17 100 %  10/18/21 2301 128/79 98.1 F (36.7 C) Oral (!) 51 16 100 %  10/18/21 2157 (!) 142/76 98.2 F (36.8 C) Oral (!) 50 16 98 %  10/18/21 2130 100/64 -- -- 68 -- --  10/18/21 2116 131/82 -- -- (!) 58 -- --  10/18/21 2100 126/76 -- -- (!) 58 -- --  10/18/21 2045 125/60 -- -- (!) 57 -- --  10/18/21 2030 (!) 142/71 -- -- 61 18 --  10/18/21 2016 (!) 117/103 -- -- 87 -- --     Physical Exam:  General: alert, cooperative, appears stated age, and no distress Lochia: appropriate Uterine Fundus: firm Incision: NA DVT Evaluation: No evidence of DVT seen on physical exam.  Recent Labs    10/17/21 2219 10/18/21 0954  HGB 11.3* 11.5*  HCT 33.8* 35.3*      Latest Ref Rng & Units 10/19/2021   11:49 AM 10/17/2021   10:19 PM 09/30/2020    3:03 AM  CMP  Glucose 70 - 99 mg/dL 81  85  90   BUN 6 - 20 mg/dL 5  6  4    Creatinine 0.44 - 1.00 mg/dL 10/02/2020   4.09   Sodium 135 - 145 mmol/L 137  136  141   Potassium 3.5 - 5.1 mmol/L 3.9  3.9  3.5   Chloride 98 - 111 mmol/L 107  108  107   CO2 22 - 32 mmol/L 23  20    Calcium 8.9 - 10.3 mg/dL 8.9  8.8    Total Protein 6.5 - 8.1 g/dL 6.0  6.0    Total Bilirubin 0.3 - 1.2 mg/dL 0.5  0.4    Alkaline Phos 38 - 126 U/L 150  174    AST 15 - 41 U/L 28  24    ALT  0 - 44 U/L 18  17       Assessment/Plan: Plan for discharge tomorrow, Breastfeeding, Circumcision prior to discharge, and Contraception depo. Started Procardia 30 XL and Lasix for HTN BP check in 1 week    LOS: 2 days   8.11 10/19/2021, 8:09 PM

## 2021-10-19 NOTE — Progress Notes (Signed)
Dr Annia Friendly notified of increase B/P when she was rounding on floor/stated she would look at it

## 2021-10-19 NOTE — Clinical Social Work Maternal (Signed)
CLINICAL SOCIAL WORK MATERNAL/CHILD NOTE  Patient Details  Name: Sarah Richards MRN: [id number] Date of Birth: 2000/05/09  Date:  10/19/2021  Clinical Social Worker Initiating Note:  Abundio Miu, [place] Date/Time: Initiated:  10/19/21/1609     Child's Name:  Sarah Richards   Biological Parents:  Mother, Father (Father: Avel Sensor 10/19/99)   Need for Interpreter:  None   Reason for Referral:  Behavioral Health Concerns, Current Substance Use/Substance Use During Pregnancy     Address:  [address] [place] [address]    Phone number:  [phone] (home)     Additional phone number:   Household Members/Support Persons (HM/SP):   Household Member/Support Person 1, Household Member/Support Person 2   HM/SP Name Relationship DOB or Age  HM/SP -[place] mother    HM/SP -2   brothers    HM/SP -3        HM/SP -4        HM/SP -5        HM/SP -6        HM/SP -7        HM/SP -8          Natural Supports (not living in the home):  Immediate Family   Professional Supports: None   Employment: Unemployed   Type of Work:     Education:  Programmer, systems   Homebound arranged:    Museum/gallery curator Resources:  Medicaid   Other Resources:  [facility]   Cultural/Religious Considerations Which May Impact Care:    Strengths:  Ability to meet basic needs  , Engineer, materials, Home prepared for child     Psychotropic Medications:         Pediatrician:    [place] area  Pediatrician List:   [facility]  [place]      Pediatrician Fax Number:    Risk Factors/Current Problems:  Substance Use  , Mental Health Concerns     Cognitive State:  Able to Concentrate  , Alert  , Goal Oriented  , Linear Thinking     Mood/Affect:  Calm  , Interested  , Comfortable  , Relaxed  , Happy     CSW Assessment: CSW met with MOB at bedside to complete  psychosocial assessment, FOB present. CSW introduced self and asked to speak with MOB privately, FOB left the room. CSW explained reason for consult. MOB was welcoming, pleasant, and remained engaged during assessment. MOB reported that she resides with her mother and 2 brothers. MOB reported that they have all items needed to care for infant including a car seat and basinet. CSW inquired about MOB's support system, MOB reported that her mom and sisters are supports.   CSW inquired about MOB's mental health history. MOB reported that she has not been diagnosed with anxiety but has experienced anxiety all of her life. MOB described her anxiety as worrying and racing thoughts. MOB reported that she is not taking any medication to treat anxiety and shared that she has participated in therapy in the past (2020), noting it was good. CSW asked if MOB was interested in therapy resources, MOB reported yes. CSW provided MOB with local therapy resources. CSW inquired about MOB's coping skills, MOB reported that prior to pregnancy sleeping was a coping skill then during the pregnancy crying then sleeping was a coping skill. CSW acknowledged and normalized emotions  and feeling feelings as they present themselves. MOB denied any additional mental health history. CSW inquired about how MOB was feeling emotionally since giving birth, MOB reported that she is feeling excited and shared that the experience feels surreal. MOB reported that she feels attached and bonded with infant and loves infant. MOB presented calm and did not demonstrate any acute mental health signs/symptoms. CSW assessed for safety, MOB denied SI, HI, and domestic violence.   CSW provided education regarding the baby blues period vs. perinatal mood disorders, discussed treatment and gave resources for mental health follow up if concerns arise.  CSW recommends self-evaluation during the postpartum time period using the New Mom Checklist from Postpartum  Progress and encouraged MOB to contact a medical professional if symptoms are noted at any time.    CSW provided review of Sudden Infant Death Syndrome (SIDS) precautions.    CSW informed MOB about the hospital drug screen policy due to documented substance use during pregnancy. MOB confirmed marijuana use during pregnancy and reported last use as February. MOB denied any additional substance use. CSW informed MOB that infant's UDS and CDS would be monitored and a CPS report would be made if warranted. MOB verbalized understanding and denied any questions.   CSW identifies no further need for intervention and no barriers to discharge at this time.   CSW Plan/Description:  Sudden Infant Death Syndrome (SIDS) Education, Perinatal Mood and Anxiety Disorder (PMADs) Education, No Further Intervention Required/No Barriers to Discharge, [place], CSW Will Continue to Monitor Umbilical Cord Tissue Drug Screen Results and Make Report if Warranted, Other Information/Referral to [employer], [place] 10/19/2021, 4:12 PM

## 2021-10-19 NOTE — Anesthesia Postprocedure Evaluation (Signed)
Anesthesia Post Note  Patient: Sarah Richards  Procedure(s) Performed: AN AD HOC LABOR EPIDURAL     Patient location during evaluation: Mother Baby Anesthesia Type: Epidural Level of consciousness: awake, awake and alert and oriented Pain management: pain level controlled Vital Signs Assessment: post-procedure vital signs reviewed and stable Respiratory status: spontaneous breathing Cardiovascular status: stable Postop Assessment: no headache, patient able to bend at knees, no apparent nausea or vomiting, adequate PO intake and able to ambulate Anesthetic complications: no   No notable events documented.  Last Vitals:  Vitals:   10/19/21 0731 10/19/21 0920  BP: (!) 155/80 (!) 152/86  Pulse:  (!) 48  Resp:  20  Temp:  37 C  SpO2:      Last Pain:  Vitals:   10/19/21 0920  TempSrc:   PainSc: 2    Pain Goal:                   Pascha Fogal

## 2021-10-20 MED ORDER — WHITE PETROLATUM EX OINT: 1.0000 | TOPICAL_OINTMENT | CUTANEOUS | Status: DC | PRN

## 2021-10-20 MED ORDER — NIFEDIPINE ER 30 MG PO TB24: 30.0000 mg | ORAL_TABLET | Freq: Every day | ORAL | 0 refills | Status: DC

## 2021-10-20 MED ORDER — EPINEPHRINE TOPICAL FOR CIRCUMCISION 0.1 MG/ML: 1.0000 [drp] | TOPICAL | Status: DC | PRN

## 2021-10-20 MED ORDER — LIDOCAINE 1% INJECTION FOR CIRCUMCISION
0.8000 mL | INJECTION | Freq: Once | INTRAVENOUS | Status: DC
  Filled 2021-10-20: qty 1

## 2021-10-20 MED ORDER — IBUPROFEN 600 MG PO TABS: 600.0000 mg | ORAL_TABLET | Freq: Four times a day (QID) | ORAL | 0 refills | Status: DC

## 2021-10-20 MED ORDER — FUROSEMIDE 20 MG PO TABS
20.0000 mg | ORAL_TABLET | Freq: Every day | ORAL | 0 refills | Status: DC
Start: 2021-10-21 — End: 2023-07-01

## 2021-10-20 MED ORDER — SUCROSE 24% NICU/PEDS ORAL SOLUTION: 0.5000 mL | OROMUCOSAL | Status: DC | PRN

## 2021-10-20 NOTE — Lactation Note (Signed)
This note was copied from a baby's chart. Lactation Consultation Note  Patient Name: Sarah Richards [id number] Date: 10/20/2021 Reason for consult: Follow-up assessment;1st time breastfeeding;Primapara;Term Age:21 hours  LC in to visit with P1 Mom of term baby.  Baby is at a 2% weight loss and has had 3 stools (transitional) and 2 voids last 24 hrs.  Mom reports a "biting" feeling at beginning of feeding at times, reviewed how to take baby off and re-latch where baby is deeper on the breast.  Last breastfeeding, her RN Dolly Rias) gave a 9 latch score.  Mom does hear swallows and denies pinching pain throughout the feeding.  No visible trauma noted on nipples.  Reviewed hand expression and transitional milk sprayed across the bed.  Hand pump provided and instructed on use and care of pump.  24 mm flanges appear to be the proper size.  Engorgement prevention and treatment reviewed. Encouraged keeping baby STS and offering the breast often with feeding cues.  Mom aware of OP lactation support and encouraged to call prn.   LATCH Score Latch: Grasps breast easily, tongue down, lips flanged, rhythmical sucking.  Audible Swallowing: A few with stimulation  Type of Nipple: Everted at rest and after stimulation  Comfort (Breast/Nipple): Soft / non-tender  Hold (Positioning): No assistance needed to correctly position infant at breast.  LATCH Score: 9   Lactation Tools Discussed/Used Tools: Pump;Flanges Flange Size: 24 Breast pump type: Manual Pump Education: Setup, frequency, and cleaning Reason for Pumping: prn Pumping frequency: prn  Interventions Interventions: Breast feeding basics reviewed;Skin to skin;Breast massage;Hand express;Hand pump;Education  Discharge Discharge Education: Engorgement and breast care;Warning signs for feeding baby Pump: Manual  Consult Status Consult Status: Complete Date: 10/20/21 Follow-up type: Call as needed    Judee Clara 10/20/2021, 1:23 PM

## 2021-10-27 ENCOUNTER — Ambulatory Visit: Payer: Medicaid Other

## 2021-10-28 ENCOUNTER — Telehealth (HOSPITAL_COMMUNITY): Payer: Self-pay | Admitting: *Deleted

## 2022-06-22 IMAGING — US US MFM OB DETAIL+14 WK
1 series · 13 of 28 positions shown · non-contrast
Comparison: none

[Series 1: us mfm ob detail+14 wk · 79 acquisitions, 13 frames shown]
[im 3/79]
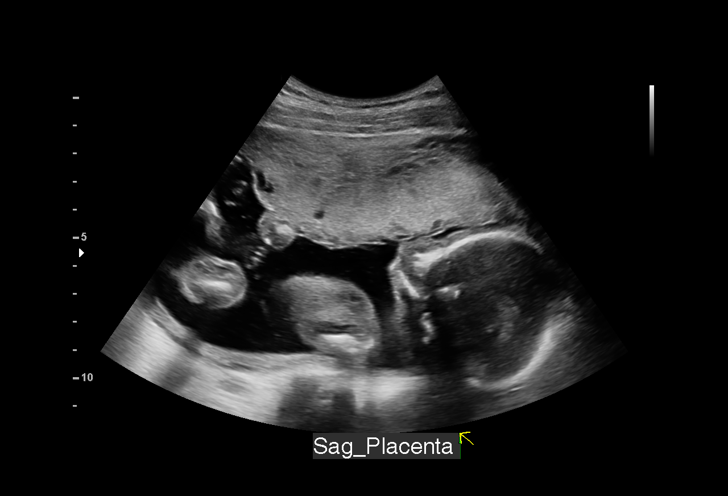
[im 9/79]
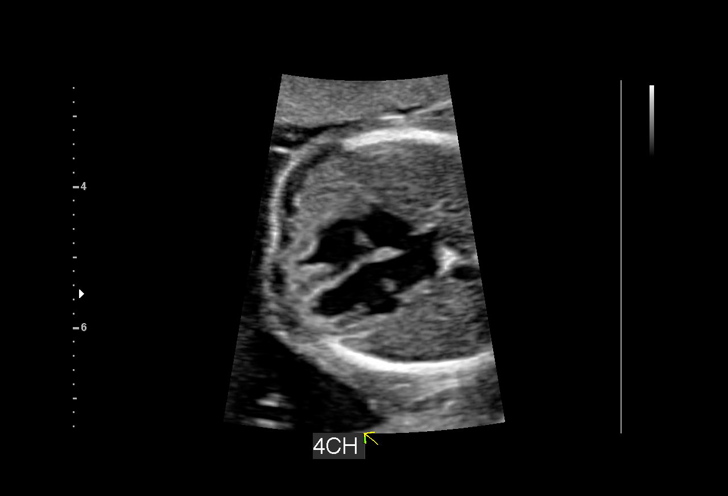
[im 15/79]
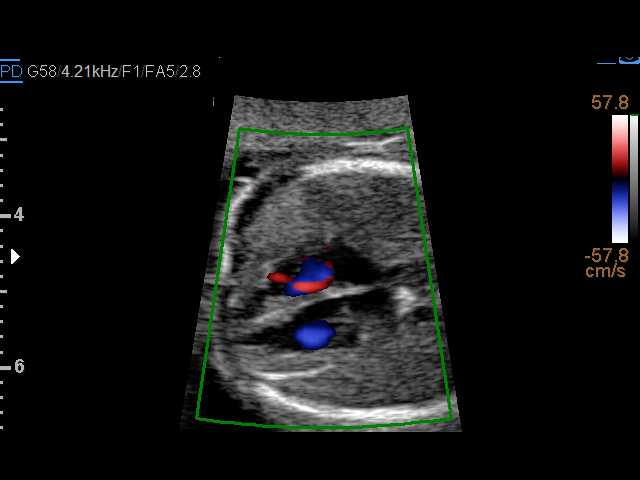
[im 21/79]
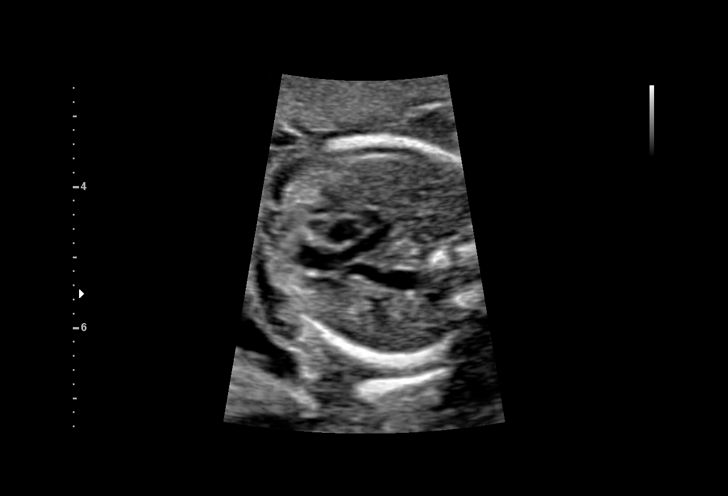
[im 27/79]
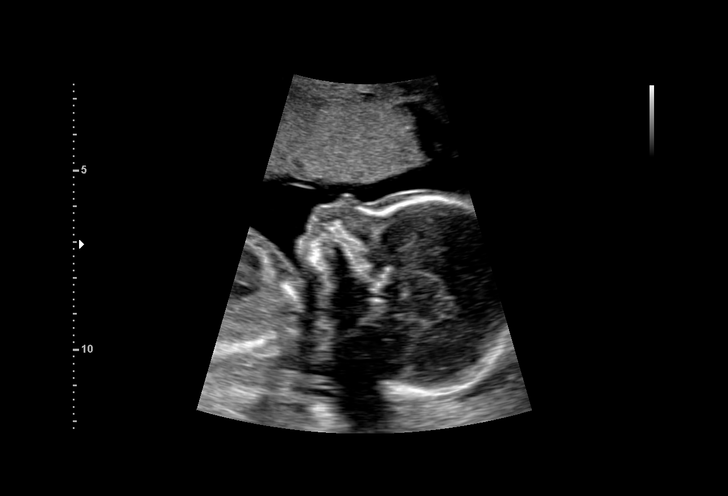
[im 32/79]
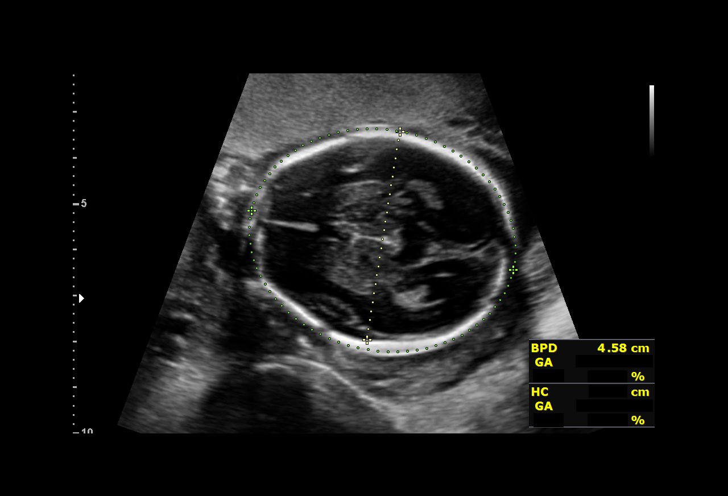
[im 41/79]
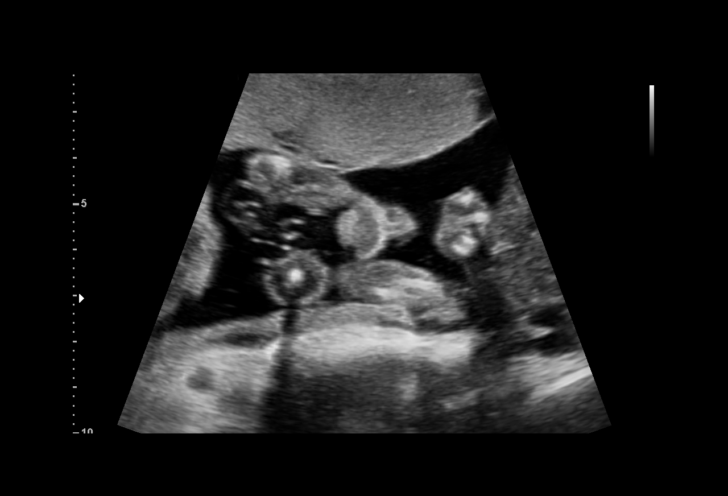
[im 47/79]
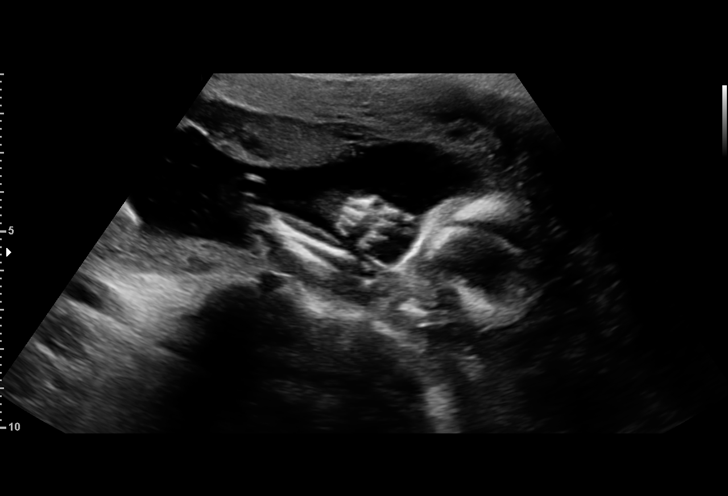
[im 53/79]
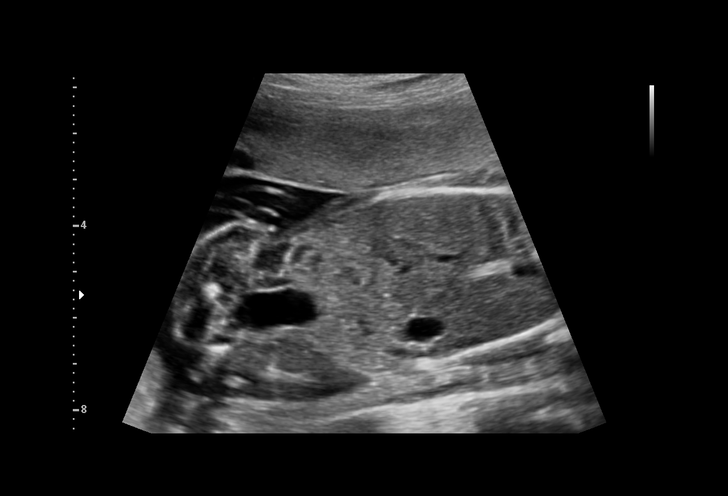
[im 58/79]
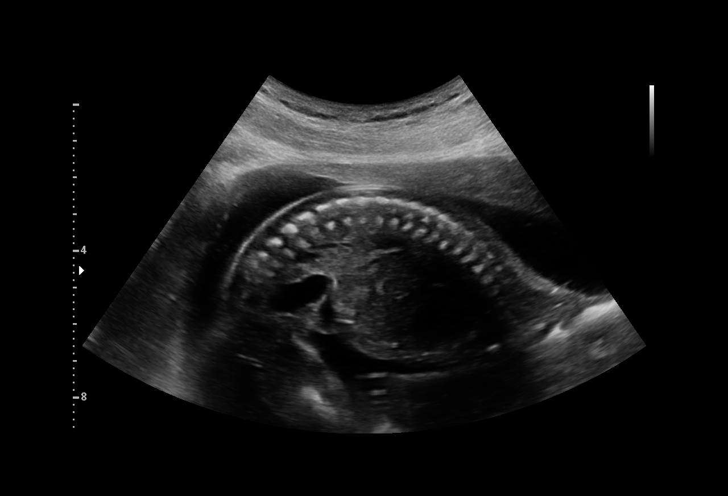
[im 64/79]
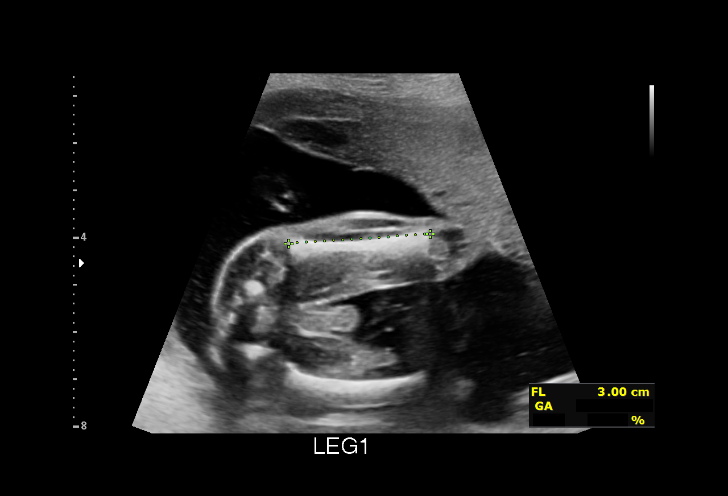
[im 70/79]
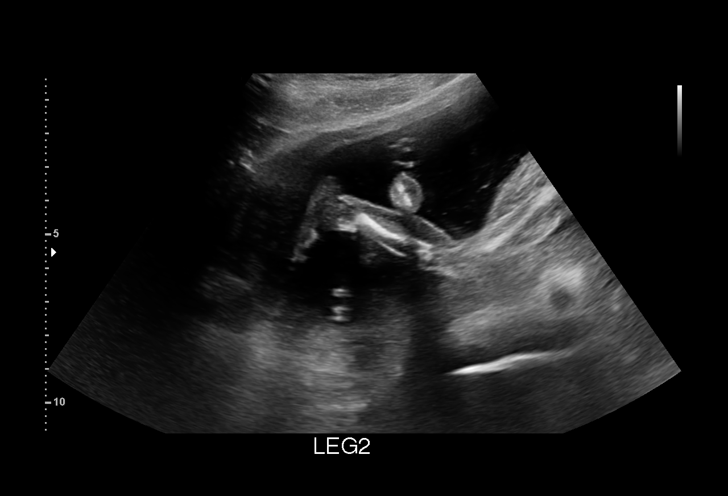
[im 76/79]
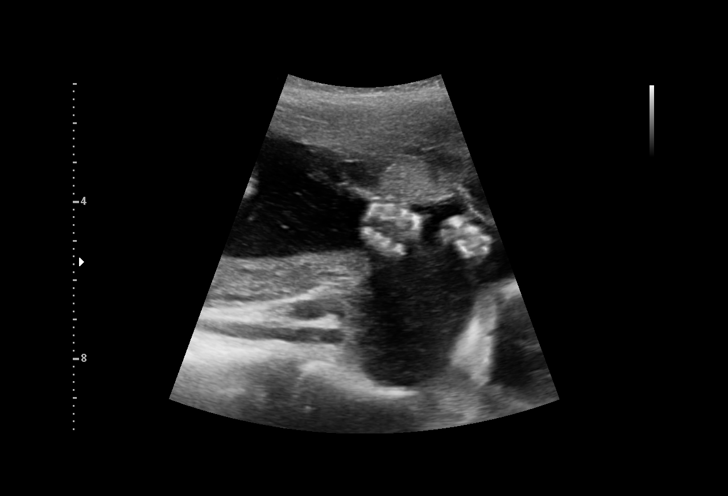

[13 of 28 positions shown; findings below may reference images not displayed]

STEPHEN NP

Indications

 19 weeks gestation of pregnancy
 Encounter for antenatal screening for
 malformations
 Genetic carrier - silent Leonilde Gongo Kamata
 LR NIPS
Fetal Evaluation

 Num Of Fetuses:         1
 Fetal Heart Rate(bpm):  143
 Cardiac Activity:       Observed
 Presentation:           Cephalic
 Placenta:               Anterior
 P. Cord Insertion:      Visualized, central

 Amniotic Fluid
 AFI FV:      Within normal limits

                             Largest Pocket(cm)

Biometry

 BPD:      46.1  mm     G. Age:  20w 0d         72  %    CI:        76.49   %    70 - 86
                                                         FL/HC:      18.0   %    16.1 -
 HC:       167   mm     G. Age:  19w 3d         40  %    HC/AC:      1.13        1.09 -
 AC:      147.2  mm     G. Age:  20w 0d         65  %    FL/BPD:     65.1   %
 FL:         30  mm     G. Age:  19w 2d         37  %    FL/AC:      20.4   %    20 - 24
 HUM:      30.4  mm     G. Age:  20w 0d         67  %
 CER:      20.5  mm     G. Age:  19w 5d         64  %
 NFT:       3.6  mm

 LV:        4.9  mm
 CM:        3.3  mm

 Est. FW:     304  gm    0 lb 11 oz      58  %
OB History

 Gravidity:    1
 Living:       0
Gestational Age

 LMP:           19w 3d        Date:  01/11/21                 EDD:   10/18/21
 U/S Today:     19w 5d                                        EDD:   10/16/21
 Best:          19w 3d     Det. By:  LMP  (01/11/21)          EDD:   10/18/21
Anatomy

 Cranium:               Appears normal         Aortic Arch:            Appears normal
 Cavum:                 Appears normal         Ductal Arch:            Appears normal
 Ventricles:            Appears normal         Diaphragm:              Appears normal
 Choroid Plexus:        Appears normal         Stomach:                Appears normal, left
                                                                       sided
 Cerebellum:            Appears normal         Abdomen:                Appears normal
 Posterior Fossa:       Appears normal         Abdominal Wall:         Appears nml (cord
                                                                       insert, abd wall)
 Nuchal Fold:           Appears normal         Cord Vessels:           Appears normal (3
                                                                       vessel cord)
 Face:                  Appears normal         Kidneys:                Appear normal
                        (orbits and profile)
 Lips:                  Appears normal         Bladder:                Appears normal
 Thoracic:              Appears normal         Spine:                  Appears normal
 Heart:                 Appears normal         Upper Extremities:      Appears normal
                        (4CH, axis, and
                        situs)
 RVOT:                  Appears normal         Lower Extremities:      Appears normal
 LVOT:                  Appears normal

 Other:  VC, 3VV and 3VTV visualized. Fetus appears to be a male.
Cervix Uterus Adnexa

 Cervix
 Length:           3.75  cm.
 Normal appearance by transabdominal scan.

 Right Ovary
 Visualized.

 Left Ovary
 Not visualized.
Impression

 G1 P0.  Patient is here for fetal anatomy scan.
 On cell-free fetal DNA screening, the risks of fetal
 aneuploidies are not increased .

 We performed fetal anatomy scan. No makers of
 aneuploidies or fetal structural defects are seen. Fetal
 biometry is consistent with her previously-established dates.
 Amniotic fluid is normal and good fetal activity is seen.
 Patient understands the limitations of ultrasound in detecting
 fetal anomalies.
 Patient is a silent carrier for alpha thalassemia.  The couple
 met with our genetic counselor.  You will be receiving a
 separate letter from our genetic counselor.
Recommendations

 Follow-up scans as clinically indicated.
                 Bariller, Pierre Didier

## 2022-08-12 ENCOUNTER — Other Ambulatory Visit: Payer: Self-pay

## 2022-08-12 ENCOUNTER — Emergency Department (HOSPITAL_COMMUNITY): Payer: Medicaid Other

## 2022-08-12 ENCOUNTER — Emergency Department (HOSPITAL_COMMUNITY)
Admission: EM | Admit: 2022-08-12 | Discharge: 2022-08-12 | Disposition: A | Discharge status: Home / Self Care | Payer: Medicaid Other | Attending: Emergency Medicine | Admitting: Emergency Medicine

## 2022-08-12 ENCOUNTER — Encounter (HOSPITAL_COMMUNITY): Payer: Self-pay

## 2022-08-12 DIAGNOSIS — S99922A Unspecified injury of left foot, initial encounter: Secondary | ICD-10-CM | POA: Diagnosis not present

## 2022-08-12 DIAGNOSIS — M79672 Pain in left foot: Secondary | ICD-10-CM | POA: Diagnosis not present

## 2022-08-12 DIAGNOSIS — M542 Cervicalgia: Secondary | ICD-10-CM | POA: Diagnosis not present

## 2022-08-12 DIAGNOSIS — R519 Headache, unspecified: Secondary | ICD-10-CM | POA: Insufficient documentation

## 2022-08-12 DIAGNOSIS — Y92003 Bedroom of unspecified non-institutional (private) residence as the place of occurrence of the external cause: Secondary | ICD-10-CM | POA: Insufficient documentation

## 2022-08-12 DIAGNOSIS — Z043 Encounter for examination and observation following other accident: Secondary | ICD-10-CM | POA: Diagnosis not present

## 2022-08-12 DIAGNOSIS — W06XXXA Fall from bed, initial encounter: Secondary | ICD-10-CM | POA: Insufficient documentation

## 2022-08-12 MED ORDER — PREDNISONE 10 MG (21) PO TBPK: ORAL_TABLET | Freq: Every day | ORAL | 0 refills | Status: DC

## 2022-08-12 NOTE — Discharge Instructions (Addendum)
You are seen today in the emergency department due to neck and foot pain.  The imaging studies were reassuring, take the steroid taper as prescribed to help with the numbness.  This should resolve in the next week or so, if you continue having symptoms please follow-up with Dr. Johnsie Cancel your surgery.
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
## 2022-08-18 DIAGNOSIS — L309 Dermatitis, unspecified: Secondary | ICD-10-CM | POA: Diagnosis not present

## 2022-10-01 ENCOUNTER — Emergency Department (HOSPITAL_COMMUNITY): Payer: Medicaid Other

## 2022-10-01 ENCOUNTER — Other Ambulatory Visit: Payer: Self-pay

## 2022-10-01 ENCOUNTER — Emergency Department (HOSPITAL_COMMUNITY)
Admission: EM | Admit: 2022-10-01 | Discharge: 2022-10-01 | Disposition: A | Discharge status: Home / Self Care | Payer: Medicaid Other | Attending: Emergency Medicine | Admitting: Emergency Medicine

## 2022-10-01 DIAGNOSIS — M25531 Pain in right wrist: Secondary | ICD-10-CM | POA: Diagnosis not present

## 2022-10-01 DIAGNOSIS — R0789 Other chest pain: Secondary | ICD-10-CM | POA: Diagnosis not present

## 2022-10-01 DIAGNOSIS — S40021A Contusion of right upper arm, initial encounter: Secondary | ICD-10-CM | POA: Insufficient documentation

## 2022-10-01 DIAGNOSIS — S199XXA Unspecified injury of neck, initial encounter: Secondary | ICD-10-CM | POA: Diagnosis not present

## 2022-10-01 DIAGNOSIS — S0990XA Unspecified injury of head, initial encounter: Secondary | ICD-10-CM | POA: Diagnosis present

## 2022-10-01 DIAGNOSIS — S40022A Contusion of left upper arm, initial encounter: Secondary | ICD-10-CM | POA: Insufficient documentation

## 2022-10-01 DIAGNOSIS — R079 Chest pain, unspecified: Secondary | ICD-10-CM | POA: Diagnosis not present

## 2022-10-01 DIAGNOSIS — S0083XA Contusion of other part of head, initial encounter: Secondary | ICD-10-CM | POA: Insufficient documentation

## 2022-10-01 DIAGNOSIS — T07XXXA Unspecified multiple injuries, initial encounter: Secondary | ICD-10-CM

## 2022-10-01 DIAGNOSIS — M79641 Pain in right hand: Secondary | ICD-10-CM | POA: Diagnosis not present

## 2022-10-01 DIAGNOSIS — S060X0A Concussion without loss of consciousness, initial encounter: Secondary | ICD-10-CM | POA: Diagnosis not present

## 2022-10-01 LAB — BASIC METABOLIC PANEL
Anion gap: 10 (ref 5–15)
BUN: 9 mg/dL (ref 6–20)
CO2: 23 mmol/L (ref 22–32)
Calcium: 9.6 mg/dL (ref 8.9–10.3)
Chloride: 107 mmol/L (ref 98–111)
Creatinine, Ser: 0.88 mg/dL (ref 0.44–1.00)
GFR, Estimated: >60 mL/min (ref >60)
Glucose, Bld: 78 mg/dL (ref 70–99)
Potassium: 3.7 mmol/L (ref 3.5–5.1)
Sodium: 140 mmol/L (ref 135–145)

## 2022-10-01 LAB — I-STAT CHEM 8, ED
BUN: 9 mg/dL (ref 6–20)
Calcium, Ion: 1.22 mmol/L (ref 1.15–1.40)
Chloride: 104 mmol/L (ref 98–111)
Creatinine, Ser: 0.8 mg/dL (ref 0.44–1.00)
Glucose, Bld: 73 mg/dL (ref 70–99)
HCT: 40 % (ref 36.0–46.0)
Hemoglobin: 13.6 g/dL (ref 12.0–15.0)
Potassium: 3.7 mmol/L (ref 3.5–5.1)
Sodium: 141 mmol/L (ref 135–145)
TCO2: 23 mmol/L (ref 22–32)

## 2022-10-01 LAB — I-STAT BETA HCG BLOOD, ED (MC, WL, AP ONLY): I-stat hCG, quantitative: <5 m[IU]/mL (ref <5)

## 2022-10-01 LAB — CBC WITH DIFFERENTIAL/PLATELET
Abs Immature Granulocytes: 0.02 10*3/uL (ref 0.00–0.07)
Basophils Absolute: 0 10*3/uL (ref 0.0–0.1)
Basophils Relative: 0 %
Eosinophils Absolute: 0 10*3/uL (ref 0.0–0.5)
Eosinophils Relative: 0 %
HCT: 40 % (ref 36.0–46.0)
Hemoglobin: 13.3 g/dL (ref 12.0–15.0)
Immature Granulocytes: 0 %
Lymphocytes Relative: 28 %
Lymphs Abs: 2.2 10*3/uL (ref 0.7–4.0)
MCH: 26.7 pg (ref 26.0–34.0)
MCHC: 33.3 g/dL (ref 30.0–36.0)
MCV: 80.2 fL (ref 80.0–100.0)
Monocytes Absolute: 0.8 10*3/uL (ref 0.1–1.0)
Monocytes Relative: 10 %
Neutro Abs: 4.8 10*3/uL (ref 1.7–7.7)
Neutrophils Relative %: 62 %
Platelets: 262 10*3/uL (ref 150–400)
RBC: 4.99 MIL/uL (ref 3.87–5.11)
RDW: 13.3 % (ref 11.5–15.5)
WBC: 7.9 10*3/uL (ref 4.0–10.5)
nRBC: 0 % (ref 0.0–0.2)

## 2022-10-01 MED ORDER — ONDANSETRON HCL 4 MG/2ML IJ SOLN
4.0000 mg | Freq: Once | INTRAMUSCULAR | Status: AC
  Administered 2022-10-01: 4 mg via INTRAVENOUS
  Filled 2022-10-01: qty 2

## 2022-10-01 MED ORDER — NAPROXEN 500 MG PO TABS: 500.0000 mg | ORAL_TABLET | Freq: Two times a day (BID) | ORAL | 0 refills | Status: DC

## 2022-10-01 MED ORDER — MECLIZINE HCL 25 MG PO TABS: 25.0000 mg | ORAL_TABLET | Freq: Three times a day (TID) | ORAL | 0 refills | Status: DC | PRN

## 2022-10-01 MED ORDER — IOHEXOL 350 MG/ML SOLN
75.0000 mL | Freq: Once | INTRAVENOUS | Status: AC | PRN
  Administered 2022-10-01: 75 mL via INTRAVENOUS

## 2022-10-01 MED ORDER — FENTANYL CITRATE PF 50 MCG/ML IJ SOSY
50.0000 ug | PREFILLED_SYRINGE | Freq: Once | INTRAMUSCULAR | Status: AC
  Administered 2022-10-01: 50 ug via INTRAVENOUS
  Filled 2022-10-01: qty 1

## 2022-10-01 MED ORDER — TRAMADOL HCL 50 MG PO TABS: 50.0000 mg | ORAL_TABLET | Freq: Four times a day (QID) | ORAL | 0 refills | Status: DC | PRN

## 2022-10-01 NOTE — ED Notes (Signed)
Patient in imaging at this time.

## 2022-10-01 NOTE — ED Notes (Signed)
Delayed in taking pt back to bed due to [employer] talking to pt att
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
## 2022-10-01 NOTE — Discharge Instructions (Signed)
Contact a health care provider if: Your symptoms do not improve or get worse. You have new symptoms. You have another injury. Your coordination gets worse. You have unusual behavior changes. Get help right away if: You have a severe or worsening headache. You have weakness or numbness in any part of your body, slurred speech, vision changes, or confusion. You vomit repeatedly. You lose consciousness, are sleepier than normal, or are difficult to wake up. You have a seizure. These symptoms may be an emergency. Get help right away. Call 911.

## 2022-10-01 NOTE — ED Notes (Signed)
This RN reviewed discharge instructions with patient. She verbalized understanding and denied any further questions. PT well appearing upon discharge and reports tolerable pain. Pt ambulated with stable gait to exit. Pt endorses ride home.  
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
## 2023-01-07 DIAGNOSIS — G8929 Other chronic pain: Secondary | ICD-10-CM | POA: Diagnosis not present

## 2023-01-07 DIAGNOSIS — M4322 Fusion of spine, cervical region: Secondary | ICD-10-CM | POA: Diagnosis not present

## 2023-01-07 DIAGNOSIS — H9313 Tinnitus, bilateral: Secondary | ICD-10-CM | POA: Diagnosis not present

## 2023-01-07 DIAGNOSIS — R519 Headache, unspecified: Secondary | ICD-10-CM | POA: Diagnosis not present

## 2023-01-07 DIAGNOSIS — R1319 Other dysphagia: Secondary | ICD-10-CM | POA: Diagnosis not present

## 2023-01-19 DIAGNOSIS — H6122 Impacted cerumen, left ear: Secondary | ICD-10-CM | POA: Diagnosis not present

## 2023-01-19 DIAGNOSIS — H9312 Tinnitus, left ear: Secondary | ICD-10-CM | POA: Diagnosis not present

## 2023-01-19 DIAGNOSIS — R1310 Dysphagia, unspecified: Secondary | ICD-10-CM | POA: Diagnosis not present

## 2023-01-21 ENCOUNTER — Other Ambulatory Visit: Payer: Self-pay | Admitting: Otolaryngology

## 2023-01-21 DIAGNOSIS — R1310 Dysphagia, unspecified: Secondary | ICD-10-CM

## 2023-01-25 ENCOUNTER — Inpatient Hospital Stay: Admission: RE | Admit: 2023-01-25 | Payer: Medicaid Other | Source: Ambulatory Visit

## 2023-02-19 ENCOUNTER — Other Ambulatory Visit: Payer: Medicaid Other

## 2023-02-22 ENCOUNTER — Other Ambulatory Visit: Payer: Medicaid Other

## 2023-06-21 ENCOUNTER — Emergency Department: Payer: Medicaid Other

## 2023-06-21 ENCOUNTER — Emergency Department
Admission: EM | Admit: 2023-06-21 | Discharge: 2023-06-21 | Disposition: A | Discharge status: Home / Self Care | Payer: Medicaid Other | Attending: Emergency Medicine | Admitting: Emergency Medicine

## 2023-06-21 DIAGNOSIS — O4690 Antepartum hemorrhage, unspecified, unspecified trimester: Secondary | ICD-10-CM

## 2023-06-21 DIAGNOSIS — O209 Hemorrhage in early pregnancy, unspecified: Secondary | ICD-10-CM | POA: Insufficient documentation

## 2023-06-21 DIAGNOSIS — Z3A01 Less than 8 weeks gestation of pregnancy: Secondary | ICD-10-CM | POA: Diagnosis not present

## 2023-06-21 DIAGNOSIS — O3680X0 Pregnancy with inconclusive fetal viability, not applicable or unspecified: Secondary | ICD-10-CM | POA: Diagnosis not present

## 2023-06-21 DIAGNOSIS — Z3A00 Weeks of gestation of pregnancy not specified: Secondary | ICD-10-CM | POA: Insufficient documentation

## 2023-06-21 LAB — CBC
HCT: 43.6 % (ref 36.0–46.0)
Hemoglobin: 14.3 g/dL (ref 12.0–15.0)
MCH: 26.7 pg (ref 26.0–34.0)
MCHC: 32.8 g/dL (ref 30.0–36.0)
MCV: 81.3 fL (ref 80.0–100.0)
Platelets: 316 10*3/uL (ref 150–400)
RBC: 5.36 MIL/uL — ABNORMAL HIGH (ref 3.87–5.11)
RDW: 13.1 % (ref 11.5–15.5)
WBC: 5.2 10*3/uL (ref 4.0–10.5)
nRBC: 0 % (ref 0.0–0.2)

## 2023-06-21 LAB — POC URINE PREG, ED: Preg Test, Ur: POSITIVE — AB

## 2023-06-21 LAB — HCG, QUANTITATIVE, PREGNANCY: hCG, Beta Chain, Quant, S: 1233 m[IU]/mL — ABNORMAL HIGH (ref <5)

## 2023-06-21 NOTE — ED Triage Notes (Signed)
Pt comes with vaginal bleeding. Pt states this started last night. Pt found out preg last Wed. Pt unsure how far along . Pt state cramping pain.  Pt state heavy bleeding.

## 2023-06-21 NOTE — ED Provider Notes (Signed)
[facility] Provider Note    Event Date/Time   First MD Initiated Contact with Patient 06/21/23 1557     (approximate)   History   Vaginal Bleeding   HPI  Sarah Richards is a 23 y.o. female who presents with complaint of vaginal bleeding.  Patient reports she took a pregnancy test 5 days ago which was positive.  She reports a normal menstrual cycle last month in the middle of the month.  She also plans to have a abortion and her appointment is scheduled in 3 days     Physical Exam   Triage Vital Signs: ED Triage Vitals  Encounter Vitals Group     BP 06/21/23 0944 130/88     Systolic BP Percentile --      Diastolic BP Percentile --      Pulse Rate 06/21/23 0944 60     Resp 06/21/23 0944 18     Temp 06/21/23 0944 98 F (36.7 C)     Temp Source 06/21/23 1441 Oral     SpO2 06/21/23 0944 100 %     Weight 06/21/23 0943 52.2 kg (115 lb)     Height 06/21/23 0943 1.651 m (5\' 5" )     Head Circumference --      Peak Flow --      Pain Score 06/21/23 0943 10     Pain Loc --      Pain Education --      Exclude from Growth Chart --     Most recent vital signs: Vitals:   06/21/23 0944 06/21/23 1441  BP: 130/88 116/84  Pulse: 60 (!) 55  Resp: 18 18  Temp: 98 F (36.7 C) 98.4 F (36.9 C)  SpO2: 100% 100%     General: Awake, no distress.  CV:  Good peripheral perfusion.  Resp:  Normal effort.  Abd:  No distention.  Soft, nontender Other:     ED Results / Procedures / Treatments   Labs (all labs ordered are listed, but only abnormal results are displayed) Labs Reviewed  CBC - Abnormal; Notable for the following components:      Result Value   RBC 5.36 (*)    All other components within normal limits  HCG, QUANTITATIVE, PREGNANCY - Abnormal; Notable for the following components:   hCG, Beta Chain, Quant, S 1,233 (*)    All other components within normal limits  POC URINE PREG, ED - Abnormal; Notable for the following components:   Preg  Test, Ur Positive (*)    All other components within normal limits  TYPE AND SCREEN     EKG     RADIOLOGY Ultrasound viewed interpreted by me demonstrates no clear IUP, confirmed by radiology    PROCEDURES:  Critical Care performed:   Procedures   MEDICATIONS ORDERED IN ED: Medications - No data to display   IMPRESSION / MDM / ASSESSMENT AND PLAN / ED COURSE  I reviewed the triage vital signs and the nursing notes. Patient's presentation is most consistent with acute presentation with potential threat to life or bodily function.  Patient resents with vaginal bleeding in the setting of pregnancy.  Differential includes subchorionic hematoma, miscarriage, ectopic pregnancy  Elevated hCG but no visualized IUP, differential includes early pregnancy, miscarriage, ectopic.  Patient is feeling well and overall well-appearing, lab work reviewed  Given plans for abortion no follow-up hCG recommended at this time, return precautions discussed, she agrees with this plan  FINAL CLINICAL IMPRESSION(S) / ED DIAGNOSES   Final diagnoses:  Vaginal bleeding in pregnancy     Rx / DC Orders   ED Discharge Orders     None        Note:  This document was prepared using Dragon voice recognition software and may include unintentional dictation errors.   Jene Every, MD 06/21/23 [phone]

## 2023-06-21 NOTE — ED Notes (Signed)
Pt in [place] and unable to recheck vitals at this time.

## 2023-06-30 ENCOUNTER — Encounter (HOSPITAL_COMMUNITY): Payer: Self-pay | Admitting: Emergency Medicine

## 2023-06-30 ENCOUNTER — Other Ambulatory Visit: Payer: Self-pay

## 2023-06-30 ENCOUNTER — Emergency Department (HOSPITAL_COMMUNITY)
Admission: EM | Admit: 2023-06-30 | Discharge: 2023-07-01 | Disposition: A | Discharge status: Home / Self Care | Payer: Medicaid Other | Attending: Emergency Medicine | Admitting: Emergency Medicine

## 2023-06-30 DIAGNOSIS — M542 Cervicalgia: Secondary | ICD-10-CM | POA: Insufficient documentation

## 2023-06-30 DIAGNOSIS — Y92410 Unspecified street and highway as the place of occurrence of the external cause: Secondary | ICD-10-CM | POA: Diagnosis not present

## 2023-06-30 DIAGNOSIS — S0990XA Unspecified injury of head, initial encounter: Secondary | ICD-10-CM | POA: Diagnosis not present

## 2023-06-30 DIAGNOSIS — R42 Dizziness and giddiness: Secondary | ICD-10-CM | POA: Diagnosis not present

## 2023-06-30 DIAGNOSIS — S199XXA Unspecified injury of neck, initial encounter: Secondary | ICD-10-CM | POA: Diagnosis not present

## 2023-06-30 DIAGNOSIS — R519 Headache, unspecified: Secondary | ICD-10-CM | POA: Diagnosis not present

## 2023-06-30 DIAGNOSIS — M549 Dorsalgia, unspecified: Secondary | ICD-10-CM | POA: Diagnosis not present
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
## 2023-07-01 ENCOUNTER — Emergency Department (HOSPITAL_COMMUNITY): Payer: Medicaid Other

## 2023-07-01 DIAGNOSIS — S0990XA Unspecified injury of head, initial encounter: Secondary | ICD-10-CM | POA: Diagnosis not present

## 2023-07-01 LAB — CBC WITH DIFFERENTIAL/PLATELET
Abs Immature Granulocytes: 0.01 10*3/uL (ref 0.00–0.07)
Basophils Absolute: 0 10*3/uL (ref 0.0–0.1)
Basophils Relative: 1 %
Eosinophils Absolute: 0.3 10*3/uL (ref 0.0–0.5)
Eosinophils Relative: 8 %
HCT: 38.1 % (ref 36.0–46.0)
Hemoglobin: 12.6 g/dL (ref 12.0–15.0)
Immature Granulocytes: 0 %
Lymphocytes Relative: 42 %
Lymphs Abs: 1.7 10*3/uL (ref 0.7–4.0)
MCH: 27 pg (ref 26.0–34.0)
MCHC: 33.1 g/dL (ref 30.0–36.0)
MCV: 81.8 fL (ref 80.0–100.0)
Monocytes Absolute: 0.4 10*3/uL (ref 0.1–1.0)
Monocytes Relative: 9 %
Neutro Abs: 1.6 10*3/uL — ABNORMAL LOW (ref 1.7–7.7)
Neutrophils Relative %: 40 %
Platelets: 249 10*3/uL (ref 150–400)
RBC: 4.66 MIL/uL (ref 3.87–5.11)
RDW: 12.7 % (ref 11.5–15.5)
WBC: 4.1 10*3/uL (ref 4.0–10.5)
nRBC: 0 % (ref 0.0–0.2)

## 2023-07-01 LAB — CBG MONITORING, ED: Glucose-Capillary: 90 mg/dL (ref 70–99)

## 2023-07-01 LAB — HCG, QUANTITATIVE, PREGNANCY: hCG, Beta Chain, Quant, S: 8 m[IU]/mL — ABNORMAL HIGH (ref <5)

## 2023-07-01 LAB — COMPREHENSIVE METABOLIC PANEL
ALT: 23 U/L (ref 0–44)
AST: 25 U/L (ref 15–41)
Albumin: 4 g/dL (ref 3.5–5.0)
Alkaline Phosphatase: 34 U/L — ABNORMAL LOW (ref 38–126)
Anion gap: 8 (ref 5–15)
BUN: 7 mg/dL (ref 6–20)
CO2: 25 mmol/L (ref 22–32)
Calcium: 9.4 mg/dL (ref 8.9–10.3)
Chloride: 106 mmol/L (ref 98–111)
Creatinine, Ser: 0.84 mg/dL (ref 0.44–1.00)
GFR, Estimated: >60 mL/min (ref >60)
Glucose, Bld: 92 mg/dL (ref 70–99)
Potassium: 3.4 mmol/L — ABNORMAL LOW (ref 3.5–5.1)
Sodium: 139 mmol/L (ref 135–145)
Total Bilirubin: 0.7 mg/dL (ref 0.0–1.2)
Total Protein: 6.8 g/dL (ref 6.5–8.1)

## 2023-07-01 NOTE — ED Notes (Signed)
 Patient discharged by this RN. Patient verbalized understanding of instructions with no additional questions for this RN. Patient in wheelchair to be taken home by family.

## 2023-07-01 NOTE — ED Notes (Signed)
 PA at bedside.

## 2023-07-01 NOTE — ED Notes (Signed)
Pt left due to wait time  

## 2023-07-01 NOTE — ED Notes (Signed)
Phlebotomy to obtain labs. 

## 2023-07-01 NOTE — ED Notes (Signed)
 Patient provided snacks and drink for PO challenge
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
## 2023-10-19 DIAGNOSIS — M4322 Fusion of spine, cervical region: Secondary | ICD-10-CM | POA: Diagnosis not present

## 2023-10-19 DIAGNOSIS — S129XXA Fracture of neck, unspecified, initial encounter: Secondary | ICD-10-CM | POA: Diagnosis not present

## 2023-10-22 ENCOUNTER — Other Ambulatory Visit (HOSPITAL_COMMUNITY): Payer: Self-pay | Admitting: Neurosurgery

## 2023-10-22 DIAGNOSIS — R1310 Dysphagia, unspecified: Secondary | ICD-10-CM

## 2023-11-08 ENCOUNTER — Ambulatory Visit (HOSPITAL_COMMUNITY)
Admission: RE | Admit: 2023-11-08 | Discharge: 2023-11-08 | Disposition: A | Discharge status: Home / Self Care | Source: Ambulatory Visit | Attending: Neurosurgery | Admitting: Neurosurgery

## 2023-11-08 ENCOUNTER — Ambulatory Visit (HOSPITAL_COMMUNITY)
Admission: RE | Admit: 2023-11-08 | Discharge: 2023-11-08 | Disposition: A | Discharge status: Home / Self Care | Source: Ambulatory Visit | Attending: *Deleted | Admitting: *Deleted

## 2023-11-08 DIAGNOSIS — R1310 Dysphagia, unspecified: Secondary | ICD-10-CM | POA: Insufficient documentation

## 2023-11-08 DIAGNOSIS — X58XXXA Exposure to other specified factors, initial encounter: Secondary | ICD-10-CM | POA: Diagnosis not present

## 2023-11-08 DIAGNOSIS — S129XXA Fracture of neck, unspecified, initial encounter: Secondary | ICD-10-CM | POA: Insufficient documentation
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
## 2023-12-03 ENCOUNTER — Ambulatory Visit (HOSPITAL_COMMUNITY)
Admission: EM | Admit: 2023-12-03 | Discharge: 2023-12-03 | Disposition: A | Discharge status: Home / Self Care | Attending: Emergency Medicine | Admitting: Emergency Medicine

## 2023-12-03 ENCOUNTER — Other Ambulatory Visit: Payer: Self-pay

## 2023-12-03 ENCOUNTER — Encounter (HOSPITAL_COMMUNITY): Payer: Self-pay | Admitting: *Deleted

## 2023-12-03 DIAGNOSIS — Z113 Encounter for screening for infections with a predominantly sexual mode of transmission: Secondary | ICD-10-CM | POA: Insufficient documentation

## 2023-12-03 LAB — HIV ANTIBODY (ROUTINE TESTING W REFLEX): HIV Screen 4th Generation wRfx: NONREACTIVE

## 2023-12-03 NOTE — ED Triage Notes (Signed)
 PT request STD testing because she has had unprotected sex .Patient denies any Sx's at this time. Pt will be having surgery on her neck soon and does not want to have any infection.

## 2023-12-03 NOTE — ED Provider Notes (Signed)
 [facility]    CSN: [id number] Arrival date & time: 12/03/23  1143      History   Chief Complaint Chief Complaint  Patient presents with   SEXUALLY TRANSMITTED DISEASE    HPI Judy Hocker is a 23 y.o. female.   Patient is requesting STD testing.  Patient reports that she recently had unprotected sexual intercourse.  Patient denies any known exposures to STDs.  Patient denies vaginal discharge, abnormal vaginal bleeding, vaginal pain, vaginal lesions, dysuria, hematuria, urinary frequency/urgency, abdominal pain, flank pain, and fever.  The history is provided by the patient and medical records.    Past Medical History:  Diagnosis Date   Eczema    Medical history non-contributory     Patient Active Problem List   Diagnosis Date Noted   SVD (spontaneous vaginal delivery) 10/18/2021   Gestational hypertension 10/17/2021   Alpha thalassemia silent carrier 05/27/2021   Closed cervical spine fracture (HCC) 09/30/2020    Past Surgical History:  Procedure Laterality Date   ANTERIOR CERVICAL DECOMP/DISCECTOMY FUSION N/A 09/30/2020   Procedure: ANTERIOR CERVICAL DECOMPRESSION/DISCECTOMY FUSION CERVICAL THREE TO SIX;  Surgeon: Cheryle Debby LABOR, MD;  Location: [facility] OR;  Service: Neurosurgery;  Laterality: N/A;   HERNIA REPAIR      OB History     Gravida  2   Para  1   Term  1   Preterm      AB      Living  1      SAB      IAB      Ectopic      Multiple  0   Live Births  1            Home Medications    Prior to Admission medications   Medication Sig Start Date End Date Taking? Authorizing Provider  clobetasol cream (TEMOVATE) 0.05 % Apply 1 Application topically 2 (two) times daily. Patient not taking: Reported on 07/01/2023 03/23/23   [provider]    Family History Family History  Adopted: Yes    Social History Social History   Tobacco Use   Smoking status: Never   Smokeless tobacco: Never  Vaping Use   Vaping  status: Never Used  Substance Use Topics   Alcohol use: Not Currently   Drug use: Not Currently    Frequency: 7.0 times per week    Types: Marijuana    Comment: States she smokes maijuana daily typically     Allergies   Patient has no known allergies.   Review of Systems Review of Systems  Per HPI  Physical Exam Triage Vital Signs ED Triage Vitals  Encounter Vitals Group     BP 12/03/23 1204 114/72     Girls Systolic BP Percentile --      Girls Diastolic BP Percentile --      Boys Systolic BP Percentile --      Boys Diastolic BP Percentile --      Pulse Rate 12/03/23 1204 64     Resp 12/03/23 1204 18     Temp 12/03/23 1204 98.5 F (36.9 C)     Temp src --      SpO2 12/03/23 1204 98 %     Weight --      Height --      Head Circumference --      Peak Flow --      Pain Score 12/03/23 1202 0     Pain Loc --  Pain Education --      Exclude from Growth Chart --    No data found.  Updated Vital Signs BP 114/72   Pulse 64   Temp 98.5 F (36.9 C)   Resp 18   LMP 11/14/2023   SpO2 98%   Visual Acuity Right Eye Distance:   Left Eye Distance:   Bilateral Distance:    Right Eye Near:   Left Eye Near:    Bilateral Near:     Physical Exam Vitals and nursing note reviewed.  Constitutional:      General: She is awake. She is not in acute distress.    Appearance: Normal appearance. She is well-developed and well-groomed. She is not ill-appearing.  Genitourinary:    Comments: Exam deferred Neurological:     Mental Status: She is alert.  Psychiatric:        Behavior: Behavior is cooperative.      UC Treatments / Results  Labs (all labs ordered are listed, but only abnormal results are displayed) Labs Reviewed  HIV ANTIBODY (ROUTINE TESTING W REFLEX)  RPR  CERVICOVAGINAL ANCILLARY ONLY    EKG   Radiology No results found.  Procedures Procedures (including critical care time)  Medications Ordered in UC Medications - No data to  display  Initial Impression / Assessment and Plan / UC Course  I have reviewed the triage vital signs and the nursing notes.  Pertinent labs & imaging results that were available during my care of the patient were reviewed by me and considered in my medical decision making (see chart for details).     Patient is overall well-appearing.  Vitals are stable.  GU exam deferred.  Patient performed self swab for STD.  HIV and RPR ordered.  Discussed follow-up and return precautions. Final Clinical Impressions(s) / UC Diagnoses   Final diagnoses:  Screening for STD (sexually transmitted disease)     Discharge Instructions      Your results will come back over the next few days and someone will call if results are positive and require treatment.  Return here as needed.    ED Prescriptions   None    PDMP not reviewed this encounter.   Johnie Flaming A, NP 12/03/23 1234

## 2023-12-03 NOTE — Discharge Instructions (Signed)
 Your results will come back over the next few days and someone will call if results are positive and require treatment.  Return here as needed.

## 2023-12-04 LAB — RPR: RPR Ser Ql: NONREACTIVE

## 2023-12-06 ENCOUNTER — Ambulatory Visit (HOSPITAL_COMMUNITY): Payer: Self-pay

## 2023-12-06 LAB — CERVICOVAGINAL ANCILLARY ONLY
Chlamydia: POSITIVE — AB
Comment: NEGATIVE
Comment: NEGATIVE
Comment: NORMAL
Neisseria Gonorrhea: NEGATIVE
Trichomonas: POSITIVE — AB

## 2023-12-06 MED ORDER — DOXYCYCLINE HYCLATE 100 MG PO TABS: 100.0000 mg | ORAL_TABLET | Freq: Two times a day (BID) | ORAL | 0 refills | Status: AC

## 2023-12-06 MED ORDER — METRONIDAZOLE 500 MG PO TABS: 500.0000 mg | ORAL_TABLET | Freq: Two times a day (BID) | ORAL | 0 refills | Status: AC

## 2024-02-07 ENCOUNTER — Other Ambulatory Visit: Payer: Self-pay

## 2024-02-07 ENCOUNTER — Encounter (HOSPITAL_COMMUNITY): Payer: Self-pay

## 2024-02-07 ENCOUNTER — Emergency Department (HOSPITAL_COMMUNITY)

## 2024-02-07 ENCOUNTER — Emergency Department (HOSPITAL_COMMUNITY)
Admission: EM | Admit: 2024-02-07 | Discharge: 2024-02-08 | Disposition: A | Discharge status: Other Acute Inpatient Hospital | Attending: Emergency Medicine | Admitting: Emergency Medicine

## 2024-02-07 DIAGNOSIS — F329 Major depressive disorder, single episode, unspecified: Secondary | ICD-10-CM

## 2024-02-07 DIAGNOSIS — F419 Anxiety disorder, unspecified: Secondary | ICD-10-CM | POA: Diagnosis not present

## 2024-02-07 DIAGNOSIS — R45851 Suicidal ideations: Secondary | ICD-10-CM

## 2024-02-07 DIAGNOSIS — F322 Major depressive disorder, single episode, severe without psychotic features: Secondary | ICD-10-CM | POA: Insufficient documentation

## 2024-02-07 DIAGNOSIS — Z0289 Encounter for other administrative examinations: Secondary | ICD-10-CM | POA: Diagnosis not present

## 2024-02-07 LAB — COMPREHENSIVE METABOLIC PANEL WITH GFR
ALT: 15 U/L (ref 0–44)
AST: 31 U/L (ref 15–41)
Albumin: 3.5 g/dL (ref 3.5–5.0)
Alkaline Phosphatase: 46 U/L (ref 38–126)
Anion gap: 8 (ref 5–15)
BUN: 6 mg/dL (ref 6–20)
CO2: 22 mmol/L (ref 22–32)
Calcium: 8.9 mg/dL (ref 8.9–10.3)
Chloride: 109 mmol/L (ref 98–111)
Creatinine, Ser: 0.74 mg/dL (ref 0.44–1.00)
GFR, Estimated: >60 mL/min (ref >60)
Glucose, Bld: 91 mg/dL (ref 70–99)
Potassium: 3.7 mmol/L (ref 3.5–5.1)
Sodium: 139 mmol/L (ref 135–145)
Total Bilirubin: 0.7 mg/dL (ref 0.0–1.2)
Total Protein: 6.6 g/dL (ref 6.5–8.1)

## 2024-02-07 LAB — CBC WITH DIFFERENTIAL/PLATELET
Abs Immature Granulocytes: 0.01 K/uL (ref 0.00–0.07)
Basophils Absolute: 0 K/uL (ref 0.0–0.1)
Basophils Relative: 1 %
Eosinophils Absolute: 1.5 K/uL — ABNORMAL HIGH (ref 0.0–0.5)
Eosinophils Relative: 24 %
HCT: 42.3 % (ref 36.0–46.0)
Hemoglobin: 13.3 g/dL (ref 12.0–15.0)
Immature Granulocytes: 0 %
Lymphocytes Relative: 21 %
Lymphs Abs: 1.3 K/uL (ref 0.7–4.0)
MCH: 26.7 pg (ref 26.0–34.0)
MCHC: 31.4 g/dL (ref 30.0–36.0)
MCV: 84.8 fL (ref 80.0–100.0)
Monocytes Absolute: 0.3 K/uL (ref 0.1–1.0)
Monocytes Relative: 5 %
Neutro Abs: 3.1 K/uL (ref 1.7–7.7)
Neutrophils Relative %: 49 %
Platelets: 372 K/uL (ref 150–400)
RBC: 4.99 MIL/uL (ref 3.87–5.11)
RDW: 13.8 % (ref 11.5–15.5)
WBC: 6.3 K/uL (ref 4.0–10.5)
nRBC: 0 % (ref 0.0–0.2)

## 2024-02-07 LAB — RAPID URINE DRUG SCREEN, HOSP PERFORMED
Amphetamines: NOT DETECTED
Barbiturates: NOT DETECTED
Benzodiazepines: NOT DETECTED
Cocaine: NOT DETECTED
Opiates: NOT DETECTED
Tetrahydrocannabinol: POSITIVE — AB

## 2024-02-07 LAB — HCG, SERUM, QUALITATIVE: Preg, Serum: NEGATIVE

## 2024-02-07 LAB — ETHANOL: Alcohol, Ethyl (B): <15 mg/dL (ref <15)

## 2024-02-07 LAB — SARS CORONAVIRUS 2 BY RT PCR: SARS Coronavirus 2 by RT PCR: NEGATIVE

## 2024-02-07 MED ORDER — FLUOXETINE HCL 20 MG PO CAPS
20.0000 mg | ORAL_CAPSULE | Freq: Every day | ORAL | Status: DC
  Administered 2024-02-07: 20 mg via ORAL
  Filled 2024-02-07: qty 1

## 2024-02-07 MED ORDER — TRIAMCINOLONE 0.1 % CREAM:EUCERIN CREAM 1:1
TOPICAL_CREAM | Freq: Three times a day (TID) | CUTANEOUS | Status: DC | PRN
  Filled 2024-02-07: qty 60

## 2024-02-07 MED ORDER — HYDROXYZINE HCL 25 MG PO TABS: 25.0000 mg | ORAL_TABLET | Freq: Three times a day (TID) | ORAL | Status: DC | PRN

## 2024-02-07 NOTE — ED Provider Triage Note (Signed)
 Emergency Medicine Provider Triage Evaluation Note  Sarah Richards , a 23 y.o. female  was evaluated in triage.  Pt complains of primarily suicidal ideation, relates this is due to a chronic skin condition where she has a dry itchy rash that is global, has been present for a long period of time, has not followed with dermatology for the same however this is been causing her great distress causing her to have feelings of desire of self-harm.  She has no active plan at the time however yesterday evening did have active suicidal ideation though did not have a particular plan but does have the means with medication to do so..  Review of Systems  Positive: As above Negative:   Physical Exam  BP 125/73   Pulse 91   Temp 98.4 F (36.9 C)   Resp 16   SpO2 100%  Gen:   Awake, no distress   Resp:  Normal effort  MSK:   Moves extremities without difficulty  Other:    Medical Decision Making  Medically screening exam initiated at 12:44 PM.  Appropriate orders placed.  Sarah Richards was informed that the remainder of the evaluation will be completed by another provider, this initial triage assessment does not replace that evaluation, and the importance of remaining in the ED until their evaluation is complete.  Initial medical clearance orders placed and TTS consult placed.   Myriam Dorn BROCKS, [place] 02/07/24 1245

## 2024-02-07 NOTE — Progress Notes (Signed)
 Pt has been accepted to [facility] on 02/07/2024 Bed assignment: 403-01  Pt meets inpatient criteria per: Elveria Batter NP  Attending Physician will be: Leita Arts RN  Report can be called to: Adult unit: [phone]  Pt can arrive after tonight after change of shift pending labs, vol, UDS, EKG.   Care Team Notified: [facility] Danika Carlo RN, Elveria Batter NP  [place] Caitlynn Ju LCSW-A   02/07/2024 3:27 PM

## 2024-02-07 NOTE — ED Notes (Signed)
Pt belongings placed in locker #1 

## 2024-02-07 NOTE — ED Notes (Signed)
 Attempted to call safe transport, driver said they are on the way to [place] to transport another patient and will give me a call back when they are back in [place].

## 2024-02-07 NOTE — ED Provider Notes (Signed)
 BH team indicates pt accepted to [facility] [place].   Pt is alert, content, and in no acute distress.  Vitals normal.  Pt currently appears stable for transfer/transport.    Bernard Drivers, MD 02/07/24 2006

## 2024-02-07 NOTE — Consult Note (Signed)
 [facility] Psychiatric Consult Initial  Patient Name: .Sarah Richards  MRN: [id number]  DOB: Jan 24, 2001  Consult Order details:  Orders (From admission, onward)     Start     Ordered   02/07/24 1238  CONSULT TO CALL ACT TEAM       Ordering Provider: Myriam Dorn BROCKS, PA  Provider:  (Not yet assigned)  Question:  Reason for Consult?  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  Suicidal ideations   Assessment  Sarah Richards is a 23 y.o. female admitted: Presented to the EDfor 02/07/2024 10:52 AM for complaints of suicidal ideation related to chronic skin condition where she has dry itchy rash that is global.  She has no previous psychiatric history and no significant medical history.  Her current presentation of depressive symptoms which include hopelessness, helplessness,, anxiety, decreased appetite and sleep with suicidal ideations is most consistent with major depressive disorder. She meets criteria for inpatient psychiatric admission based on current symptomology.  Patient takes no psychotropic medications and has no psychiatric services in place  On initial examination, patient is sitting in her chair with a blanket that covers her body.  She is cooperative and calm appearing.  She is tearful through out the assessment. She explains that for as long as she can member she has had a skin condition where she has had itchy dry skin.  However about a month ago she feels that she might of had an allergic reaction to something and it got worse.  She  has difficulty sleeping and eating due to constant itching and burning of her skin.She has lost weight but is unsure of the amount..   This has caused her to become extremely depressed and self-conscious about how she looks.  She feels helpless, hopeless, anxious, decreased motivation.  Over the past few days she has began to feel suicidal because she feels that things will never improve.  She denies having any plan in place for suicide.  She admits to attempting suicide around the age of 89 or 89 twice via overdose but she did not notify anyone at the time.  She denies access to firearms/weapons.  She denies homicidal ideations and auditory/visual hallucinations.  She does not appear manic, psychotic, delusional, or paranoid.  Discussed inpatient psychiatric admission and patient is agreeable.  She is also agreeable to start paroxetine 20 mg daily and hydroxyzine 25 mg 3 times daily as needed for anxiety  Please see plan below for detailed recommendations.   Diagnoses:  Active Hospital problems: Principal Problem:   MDD (major depressive disorder), severe (HCC)    Plan   ## Psychiatric Medication Recommendations:  Start Prozac 20 mg daily for depression and anxiety Start hydroxyzine 25 mg 3 times daily as needed for anxiety  ## Medical Decision Making Capacity: Not specifically addressed in this encounter  ## Further Work-up:  -- EDP will order dermatology consult  -- most recent EKG on 02/07/2024 had QtC of 414 -- Pertinent labwork reviewed  earlier this admission includes: CBC with differential, ethanol, CMP, hCG, UDS,-not resulted Chest x-ray 02/07/2024 COVID PCR   ## Disposition:-- We recommend inpatient psychiatric hospitalization when medically cleared. Patient is under voluntary admission status at this time; please IVC if attempts to leave hospital.    ## Behavioral / Environmental: -Utilize compassion and acknowledge the patient's experiences while setting clear and realistic  expectations for care.    ## Safety and Observation Level:  - Based on my clinical evaluation, I estimate the patient to be at low risk of self harm in the current setting. - At this time, we recommend  routine. This decision is based on my review of the chart including patient's history and current presentation, interview of the patient, mental status examination, and consideration of suicide risk including evaluating suicidal ideation, plan, intent, suicidal or self-harm behaviors, risk factors, and protective factors. This judgment is based on our ability to directly address suicide risk, implement suicide prevention strategies, and develop a safety plan while the patient is in the clinical setting. Please contact our team if there is a concern that risk level has changed.  CSSR Risk Category:C-SSRS RISK CATEGORY: High Risk  Suicide Risk Assessment: Patient has following modifiable risk factors for suicide: untreated depression, current symptoms: anxiety/panic, insomnia, impulsivity, anhedonia, hopelessness, triggering events, and pain, medical illness (ie new dx of cancer), which we are addressing by recommending inpatient psychiatric admission. Patient has following non-modifiable or demographic risk factors for suicide: history of suicide attempt Patient has the following protective factors against suicide: Supportive family, Cultural, spiritual, or religious beliefs that discourage suicide, Minor children in the home, Frustration tolerance, and no history of NSSIB  Thank you for this consult request. Recommendations have been communicated to the primary team.  We will continue to follow while awaiting psychiatric placement at this time.   Elveria VEAR Batter, NP       History of Present Illness  Relevant Aspects of Hospital ED Course:  Admitted on 02/07/2024 for complaints of suicidal ideation related to chronic skin condition where she has dry itchy rash that is global.  She has no previous  psychiatric history and no significant medical history.  Patient Report:  I cannot sleep I cannot eat all I do is scratch my skin.  Psych ROS:  Depression: Endorses Anxiety: Endorses Mania (lifetime and current): Denies Psychosis: (lifetime and current): Denies  Collateral information:  Sarah Richards mother.  Reports she has noticed that patient is extremely depressed, stays in her room all day, and has no interest in doing anything.  Patient does not drive as she does not have a driver's license.  She has also noticed that patient gets into a lot of arguments with her baby's father that tends to bring her down.  Patient has not expressed any suicidal ideations to her mother.   Review of Systems  Constitutional:  Negative for fever.  Skin:  Positive for itching and rash.  Neurological:  Negative for tremors and seizures.  Psychiatric/Behavioral:  Positive for depression and suicidal ideas. The patient is nervous/anxious and has insomnia.      Psychiatric and Social History  Psychiatric History:  Information collected from chart review and patient  Prev Dx/Sx: No previous psychiatric history Current Psych Provider: No psych provider in place Home Meds (current): Has never taken any psychotropic medications Previous Med Trials: Denies Therapy: Denies  Prior Psych Hospitalization: Denies Prior Self Harm: History of SI attempt x 2 around the age of 45 or 12-did not  tell anyone at the time Prior Violence: Denies  Family Psych History: Unknown patient adopted Family Hx suicide: Unknown patient adopted  Social History:  Developmental Hx: Denies Educational Hx: Graduated high school Occupational Hx: Unemployed Legal Hx: Denies Living Situation: Lives with her mother and 62-year-old son Spiritual Hx: Christian Access to weapons/lethal means: Denies  Substance History Denies all substance use Exam Findings  Physical Exam:  Vital Signs:  Temp:  [98.4 F (36.9 C)] 98.4 F  (36.9 C) (10/06 1048) Pulse Rate:  [91] 91 (10/06 1048) Resp:  [16] 16 (10/06 1048) BP: (125)/(73) 125/73 (10/06 1048) SpO2:  [100 %] 100 % (10/06 1048) Blood pressure 125/73, pulse 91, temperature 98.4 F (36.9 C), resp. rate 16, last menstrual period 02/07/2024, SpO2 100%, unknown if currently breastfeeding. There is no height or weight on file to calculate BMI.  Physical Exam Constitutional:      Appearance: Normal appearance.  Musculoskeletal:        General: Normal range of motion.     Cervical back: Normal range of motion.  Skin:    Findings: Rash present.  Neurological:     Mental Status: She is alert and oriented to person, place, and time.  Psychiatric:        Attention and Perception: Attention and perception normal.        Mood and Affect: Mood is anxious and depressed.        Speech: Speech normal.        Behavior: Behavior is cooperative.        Thought Content: Thought content includes suicidal ideation. Thought content does not include suicidal plan.        Cognition and Memory: Cognition normal.        Judgment: Judgment normal.     Mental Status Exam: General Appearance: Casual  Orientation:  Full (Time, Place, and Person)  Memory:  Immediate;   Good Recent;   Good Remote;   Good  Concentration:  Concentration: Good and Attention Span: Good  Recall:  Good  Attention  Good  Eye Contact:  Good  Speech:  Clear and Coherent and Normal Rate  Language:  Good  Volume:  Normal  Mood: Depressed  Affect:  Congruent  Thought Process:  Coherent  Thought Content:  Logical  Suicidal Thoughts:  Yes.  without intent/plan  Homicidal Thoughts:  No  Judgement:  Fair  Insight:  Fair  Psychomotor Activity:  Normal  Akathisia:  No  Fund of Knowledge:  Good      Assets:  Communication Skills Desire for Improvement Financial Resources/Insurance Housing Leisure Time Physical Health Resilience Social Support  Cognition:  WNL  ADL's:  Intact  AIMS (if  indicated):        Other History   These have been pulled in through the EMR, reviewed, and updated if appropriate.  Family History:  The patient's family history is not on file. She was adopted.  Medical History: Past Medical History:  Diagnosis Date   Eczema    Medical history non-contributory     Surgical History: Past Surgical History:  Procedure Laterality Date   ANTERIOR CERVICAL DECOMP/DISCECTOMY FUSION N/A 09/30/2020   Procedure: ANTERIOR CERVICAL DECOMPRESSION/DISCECTOMY FUSION CERVICAL THREE TO SIX;  Surgeon: Cheryle Debby LABOR, MD;  Location: [facility] OR;  Service: Neurosurgery;  Laterality: N/A;   HERNIA REPAIR       Medications:   Current Facility-Administered Medications:    triamcinolone 0.1 % cream : eucerin cream, 1:1, , Topical, TID PRN, Myriam Carrier  C, PA  Current Outpatient Medications:    clobetasol cream (TEMOVATE) 0.05 %, Apply 1 Application topically 2 (two) times daily. (Patient not taking: Reported on 07/01/2023), Disp: , Rfl:   Allergies: No Known Allergies  Elveria VEAR Batter, NP

## 2024-02-07 NOTE — ED Triage Notes (Signed)
 Patient here with suicidal thoughts and has tried to harm herself in the past.No ETOH or drug use, does not take any BH meds. Alert and oriented

## 2024-02-08 ENCOUNTER — Inpatient Hospital Stay (HOSPITAL_COMMUNITY)
Admission: AD | Admit: 2024-02-08 | Discharge: 2024-02-11 | DRG: 885 | Disposition: A | Discharge status: Home / Self Care | Source: Intra-hospital | Attending: Student in an Organized Health Care Education/Training Program | Admitting: Student in an Organized Health Care Education/Training Program

## 2024-02-08 ENCOUNTER — Encounter (HOSPITAL_COMMUNITY): Payer: Self-pay | Admitting: Psychiatry

## 2024-02-08 DIAGNOSIS — G8929 Other chronic pain: Secondary | ICD-10-CM | POA: Diagnosis not present

## 2024-02-08 DIAGNOSIS — L309 Dermatitis, unspecified: Secondary | ICD-10-CM | POA: Diagnosis present

## 2024-02-08 DIAGNOSIS — Z79899 Other long term (current) drug therapy: Secondary | ICD-10-CM

## 2024-02-08 DIAGNOSIS — F322 Major depressive disorder, single episode, severe without psychotic features: Principal | ICD-10-CM | POA: Diagnosis present

## 2024-02-08 DIAGNOSIS — Z1152 Encounter for screening for COVID-19: Secondary | ICD-10-CM

## 2024-02-08 DIAGNOSIS — Z91410 Personal history of adult physical and sexual abuse: Secondary | ICD-10-CM

## 2024-02-08 DIAGNOSIS — E559 Vitamin D deficiency, unspecified: Secondary | ICD-10-CM | POA: Diagnosis present

## 2024-02-08 DIAGNOSIS — F329 Major depressive disorder, single episode, unspecified: Secondary | ICD-10-CM | POA: Diagnosis not present

## 2024-02-08 DIAGNOSIS — Z981 Arthrodesis status: Secondary | ICD-10-CM | POA: Diagnosis not present

## 2024-02-08 DIAGNOSIS — R45851 Suicidal ideations: Secondary | ICD-10-CM | POA: Diagnosis present

## 2024-02-08 DIAGNOSIS — F12980 Cannabis use, unspecified with anxiety disorder: Secondary | ICD-10-CM | POA: Diagnosis present

## 2024-02-08 MED ORDER — WHITE PETROLATUM EX OINT
TOPICAL_OINTMENT | CUTANEOUS | Status: AC
  Filled 2024-02-08: qty 5

## 2024-02-08 MED ORDER — TRIAMCINOLONE ACETONIDE 0.1 % EX OINT
TOPICAL_OINTMENT | Freq: Three times a day (TID) | CUTANEOUS | Status: DC
  Filled 2024-02-08 (×2): qty 15

## 2024-02-08 MED ORDER — LORAZEPAM 2 MG/ML IJ SOLN: 2.0000 mg | Freq: Three times a day (TID) | INTRAMUSCULAR | Status: DC | PRN

## 2024-02-08 MED ORDER — HALOPERIDOL 5 MG PO TABS: 5.0000 mg | ORAL_TABLET | Freq: Three times a day (TID) | ORAL | Status: DC | PRN

## 2024-02-08 MED ORDER — ALUM & MAG HYDROXIDE-SIMETH 200-200-20 MG/5ML PO SUSP: 30.0000 mL | ORAL | Status: DC | PRN

## 2024-02-08 MED ORDER — DIPHENHYDRAMINE HCL 50 MG/ML IJ SOLN: 50.0000 mg | Freq: Three times a day (TID) | INTRAMUSCULAR | Status: DC | PRN

## 2024-02-08 MED ORDER — HYDROXYZINE HCL 25 MG PO TABS: 25.0000 mg | ORAL_TABLET | Freq: Three times a day (TID) | ORAL | Status: DC | PRN

## 2024-02-08 MED ORDER — HYDROXYZINE HCL 50 MG PO TABS
50.0000 mg | ORAL_TABLET | Freq: Three times a day (TID) | ORAL | Status: DC | PRN
  Administered 2024-02-08 – 2024-02-11 (×7): 50 mg via ORAL
  Filled 2024-02-08 (×7): qty 1

## 2024-02-08 MED ORDER — DULOXETINE HCL 30 MG PO CPEP
30.0000 mg | ORAL_CAPSULE | Freq: Every day | ORAL | Status: DC
  Administered 2024-02-08 – 2024-02-11 (×4): 30 mg via ORAL
  Filled 2024-02-08 (×4): qty 1

## 2024-02-08 MED ORDER — MAGNESIUM HYDROXIDE 400 MG/5ML PO SUSP: 30.0000 mL | Freq: Every day | ORAL | Status: DC | PRN

## 2024-02-08 MED ORDER — HYDROXYZINE HCL 25 MG PO TABS
25.0000 mg | ORAL_TABLET | ORAL | Status: AC
  Administered 2024-02-08: 25 mg via ORAL
  Filled 2024-02-08: qty 1

## 2024-02-08 MED ORDER — HALOPERIDOL LACTATE 5 MG/ML IJ SOLN: 10.0000 mg | Freq: Three times a day (TID) | INTRAMUSCULAR | Status: DC | PRN

## 2024-02-08 MED ORDER — HALOPERIDOL LACTATE 5 MG/ML IJ SOLN: 5.0000 mg | Freq: Three times a day (TID) | INTRAMUSCULAR | Status: DC | PRN

## 2024-02-08 MED ORDER — TRIAMCINOLONE 0.1 % CREAM:EUCERIN CREAM 1:1
TOPICAL_CREAM | Freq: Three times a day (TID) | CUTANEOUS | Status: DC | PRN
  Administered 2024-02-08: 1 via TOPICAL
  Filled 2024-02-08: qty 60

## 2024-02-08 MED ORDER — FLUOXETINE HCL 20 MG PO CAPS
20.0000 mg | ORAL_CAPSULE | Freq: Every day | ORAL | Status: DC
  Filled 2024-02-08: qty 1

## 2024-02-08 MED ORDER — DIPHENHYDRAMINE HCL 25 MG PO CAPS: 50.0000 mg | ORAL_CAPSULE | Freq: Three times a day (TID) | ORAL | Status: DC | PRN

## 2024-02-08 MED ORDER — TRAZODONE HCL 50 MG PO TABS
50.0000 mg | ORAL_TABLET | Freq: Every evening | ORAL | Status: DC | PRN
  Administered 2024-02-08 – 2024-02-10 (×4): 50 mg via ORAL
  Filled 2024-02-08 (×4): qty 1

## 2024-02-08 MED ORDER — ACETAMINOPHEN 325 MG PO TABS: 650.0000 mg | ORAL_TABLET | Freq: Four times a day (QID) | ORAL | Status: DC | PRN

## 2024-02-08 MED ORDER — WHITE PETROLATUM EX OINT
TOPICAL_OINTMENT | CUTANEOUS | Status: DC | PRN
  Administered 2024-02-10 – 2024-02-11 (×2): 1 via TOPICAL
  Filled 2024-02-08 (×3): qty 5

## 2024-02-08 NOTE — Group Note (Signed)
 Date:  02/08/2024 Time:  1:45 PM  Group Topic/Focus:  Recovery Goals:   The focus of this group is to identify appropriate goals for recovery and establish a plan to achieve them. [employer]: To understand individualized and supportive care plans that promote mental health recovery, enhance coping skills, and improve overall well-being, while fostering a safe and inclusive environment for all participants.   Participation Level:  Did Not Attend   [name] 02/08/2024, 1:45 PM

## 2024-02-08 NOTE — ED Notes (Signed)
 Pt showered d/t irritated skin. Stated it would be the only thing that would help her

## 2024-02-08 NOTE — Plan of Care (Signed)
   Problem: Education: Goal: Emotional status will improve Outcome: Progressing Goal: Mental status will improve Outcome: Progressing

## 2024-02-08 NOTE — Progress Notes (Signed)
(  Sleep Hours) - 3.25 (Any PRNs that were needed, meds refused, or side effects to meds)- trazodone 50mg , no meds refused, no side effects to meds  (Any disturbances and when (visitation, over night)- n/a  (Concerns raised by the patient)- n/a  (SI/HI/AVH)- denies

## 2024-02-08 NOTE — Progress Notes (Addendum)
 Admission Note:Patient is a  VOL 23 year old female. Pt was calm and cooperative during assessment. Pt has increased anxiety related to her eczema causing extreme itching. On called provider notified and Atarax ordered and  administered for itching. Pt denied SI/HI/AVH. Admission plan of care reviewed with pt, consent signed.  Personal belongings/skin assessment completed.No contraband found. Pt skin is dry, flaky, discolored and has scratches which were are open. Patient oriented to the unit, staff and room.  Routine safety checks initiated.  Verbalizes understanding of unit rules/protocols.   Patient is presently safe on the unit. No unsafe behaviors noted.  Q 15 minute safety checks maintained per unit protocol.

## 2024-02-08 NOTE — Group Note (Signed)
 Date:  02/08/2024 Time:  5:28 PM  Group Topic/Focus:  Dimensions of Wellness:   The focus of this group is to introduce the topic of wellness and discuss the role each dimension of wellness plays in total health.    Participation Level:  Did Not Attend   Annalee Larch 02/08/2024, 5:28 PM

## 2024-02-08 NOTE — Tx Team (Signed)
 Initial Treatment Plan 02/08/2024 2:17 AM Tyshay Coats [id number]    PATIENT STRESSORS: Health problems     PATIENT STRENGTHS: Communication skills  Supportive family/friends    PATIENT IDENTIFIED PROBLEMS: Pt stated her eczema is stressing her out                      DISCHARGE CRITERIA:  Improved stabilization in mood, thinking, and/or behavior  PRELIMINARY DISCHARGE PLAN: Outpatient therapy Return to previous living arrangement  PATIENT/FAMILY INVOLVEMENT: This treatment plan has been presented to and reviewed with the patient, Dare Westerman.  The patient has been given the opportunity to ask questions and make suggestions.  Revonda DELENA Land, RN 02/08/2024, 2:17 AM

## 2024-02-08 NOTE — Group Note (Signed)
 Date:  02/08/2024 Time:  10:00 AM  Group Topic/Focus:  Goals Group:   The focus of this group is to help patients establish daily goals to achieve during treatment and discuss how the patient can incorporate goal setting into their daily lives to aide in recovery.    Participation Level:  Did Not Attend   Emelee Rodocker R Ahmaud Duthie 02/08/2024, 10:00 AM
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
## 2024-02-08 NOTE — Plan of Care (Signed)
  Problem: Education: Goal: Knowledge of [facility] Education information/materials will improve Outcome: Progressing Goal: Emotional status will improve Outcome: Progressing Goal: Mental status will improve Outcome: Progressing Goal: Verbalization of understanding the information provided will improve Outcome: Progressing   Problem: Activity: Goal: Interest or engagement in activities will improve Outcome: Progressing Goal: Sleeping patterns will improve Outcome: Progressing   Problem: Coping: Goal: Ability to verbalize frustrations and anger appropriately will improve Outcome: Progressing Goal: Ability to demonstrate self-control will improve Outcome: Progressing   Problem: Health Behavior/Discharge Planning: Goal: Identification of resources available to assist in meeting health care needs will improve Outcome: Progressing Goal: Compliance with treatment plan for underlying cause of condition will improve Outcome: Progressing   Problem: Physical Regulation: Goal: Ability to maintain clinical measurements within normal limits will improve Outcome: Progressing   Problem: Safety: Goal: Periods of time without injury will increase Outcome: Progressing   Problem: Education: Goal: Knowledge of [facility] Education information/materials will improve Outcome: Progressing Goal: Emotional status will improve Outcome: Progressing Goal: Mental status will improve Outcome: Progressing Goal: Verbalization of understanding the information provided will improve Outcome: Progressing   Problem: Activity: Goal: Interest or engagement in activities will improve Outcome: Progressing Goal: Sleeping patterns will improve Outcome: Progressing   Problem: Coping: Goal: Ability to verbalize frustrations and anger appropriately will improve Outcome: Progressing Goal: Ability to demonstrate self-control will improve Outcome: Progressing   Problem: Health  Behavior/Discharge Planning: Goal: Identification of resources available to assist in meeting health care needs will improve Outcome: Progressing Goal: Compliance with treatment plan for underlying cause of condition will improve Outcome: Progressing   Problem: Physical Regulation: Goal: Ability to maintain clinical measurements within normal limits will improve Outcome: Progressing   Problem: Safety: Goal: Periods of time without injury will increase Outcome: Progressing

## 2024-02-08 NOTE — Progress Notes (Signed)
 Tour of Duty:  Prentice JINNY Angle, RN, 02/08/24, Tour of Duty: 0700-1900  SI/HI/AVH: Denies  Self-Reported   Mood: Neutral  Anxiety: Endorses Depression: Denies, but Observable Irritability: Denies  Broset  Violence Prevention Guidelines *See Row Information*: Small Violence Risk interventions implemented   LBM  Last BM Date : 02/07/24   Pain: not present  Patient Refusals (including Rx): No  >>Shift Summary: Patient observed to be anxious on unit. Patient able to make needs known. Patient observed to engage appropriately with staff and peers. Patient taking medications as prescribed. This shift, PRN medication requested and required. No reported or observed side effects to medication. No reported or observed agitation, aggression, or other acute emotional distress. Patient has global rash and required topical PRN, LP aware, NO in place. No reported or observed physical abnormalities or concerns.  Last Vitals  Vitals Weight: 53.7 kg Temp: 97.8 F (36.6 C) Temp Source: Oral Pulse Rate: 61 Resp: 16 BP: 111/60 Patient Position: (not recorded)  Admission Type  Psych Admission Type (Psych Patients Only) Admission Status: Voluntary Date 72 hour document signed : (not recorded) Time 72 hour document signed : (not recorded) Provider Notified (First and Last Name) (see details for LINK to note): (not recorded)   Psychosocial Assessment  Psychosocial Assessment Patient Complaints: Anxiety Eye Contact: Fair Facial Expression: Anxious Affect: Anxious Speech: Logical/coherent Interaction: Cautious Motor Activity: Restless Appearance/Hygiene: Unremarkable Behavior Characteristics: Cooperative, Anxious Mood: Anxious   Aggressive Behavior  Targets: (not recorded)   Thought Process  Thought Process Coherency: Within Defined Limits Content: Within Defined Limits Delusions: None reported or observed Perception: Within Defined Limits Hallucination: None reported or  observed Judgment: Limited Confusion: None  Danger to Self/Others  Danger to Self Current suicidal ideation?: Denies Description of Suicide Plan: (not recorded) Self-Injurious Behavior: (not recorded) Agreement Not to Harm Self: (not recorded) Description of Agreement: (not recorded) Danger to Others: None reported or observed

## 2024-02-08 NOTE — Group Note (Signed)
 Recreation Therapy Group Note   Group Topic:Animal Assisted Therapy   Group Date: 02/08/2024 Start Time: 9050 End Time: 1030 Facilitators: Ninette Cotta-McCall, LRT,CTRS Location: [address] Dayroom   Animal-Assisted Activity (AAA) Program Checklist/Progress Notes Patient Eligibility Criteria Checklist & Daily Group note for Rec Tx Intervention  AAA/T Program Assumption of Risk Form signed by Patient/ or Parent Legal Guardian Yes  Patient understands his/her participation is voluntary Yes  Behavioral Response:    Education: Charity fundraiser, Appropriate Animal Interaction   Education Outcome: Acknowledges education.    Affect/Mood: N/A   Participation Level: Did not attend    Clinical Observations/Individualized Feedback:      Plan: Continue to engage patient in RT group sessions 2-3x/week.   Ajit Errico-McCall, LRT,CTRS 02/08/2024 1:02 PM

## 2024-02-09 ENCOUNTER — Encounter (HOSPITAL_COMMUNITY): Payer: Self-pay

## 2024-02-09 DIAGNOSIS — F329 Major depressive disorder, single episode, unspecified: Secondary | ICD-10-CM | POA: Diagnosis not present

## 2024-02-09 LAB — TSH: TSH: 0.91 u[IU]/mL (ref 0.350–4.500)

## 2024-02-09 LAB — VITAMIN D 25 HYDROXY (VIT D DEFICIENCY, FRACTURES): Vit D, 25-Hydroxy: 21.68 ng/mL — ABNORMAL LOW (ref 30–100)

## 2024-02-09 LAB — SEDIMENTATION RATE: Sed Rate: 6 mm/h (ref 0–22)

## 2024-02-09 MED ORDER — TRIAMCINOLONE ACETONIDE 0.1 % EX CREA
TOPICAL_CREAM | Freq: Three times a day (TID) | CUTANEOUS | Status: DC
  Administered 2024-02-10: 1 via TOPICAL
  Filled 2024-02-09 (×2): qty 15

## 2024-02-09 MED ORDER — TRIAMCINOLONE ACETONIDE 0.1 % EX CREA
TOPICAL_CREAM | Freq: Three times a day (TID) | CUTANEOUS | Status: DC
  Filled 2024-02-09: qty 15

## 2024-02-09 MED ORDER — VITAMIN D (ERGOCALCIFEROL) 1.25 MG (50000 UNIT) PO CAPS
50000.0000 [IU] | ORAL_CAPSULE | ORAL | Status: DC
  Administered 2024-02-10: 50000 [IU] via ORAL
  Filled 2024-02-09: qty 1

## 2024-02-09 NOTE — Plan of Care (Signed)
  Problem: Education: Goal: Knowledge of [facility] Education information/materials will improve Outcome: Progressing Goal: Emotional status will improve Outcome: Progressing Goal: Mental status will improve Outcome: Progressing Goal: Verbalization of understanding the information provided will improve Outcome: Progressing   Problem: Activity: Goal: Interest or engagement in activities will improve Outcome: Progressing Goal: Sleeping patterns will improve Outcome: Progressing   Problem: Coping: Goal: Ability to verbalize frustrations and anger appropriately will improve Outcome: Progressing Goal: Ability to demonstrate self-control will improve Outcome: Progressing   Problem: Health Behavior/Discharge Planning: Goal: Identification of resources available to assist in meeting health care needs will improve Outcome: Progressing Goal: Compliance with treatment plan for underlying cause of condition will improve Outcome: Progressing   Problem: Physical Regulation: Goal: Ability to maintain clinical measurements within normal limits will improve Outcome: Progressing   Problem: Safety: Goal: Periods of time without injury will increase Outcome: Progressing   Problem: Education: Goal: Knowledge of [facility] Education information/materials will improve Outcome: Progressing Goal: Emotional status will improve Outcome: Progressing Goal: Mental status will improve Outcome: Progressing Goal: Verbalization of understanding the information provided will improve Outcome: Progressing   Problem: Activity: Goal: Interest or engagement in activities will improve Outcome: Progressing Goal: Sleeping patterns will improve Outcome: Progressing   Problem: Coping: Goal: Ability to verbalize frustrations and anger appropriately will improve Outcome: Progressing Goal: Ability to demonstrate self-control will improve Outcome: Progressing   Problem: Health  Behavior/Discharge Planning: Goal: Identification of resources available to assist in meeting health care needs will improve Outcome: Progressing Goal: Compliance with treatment plan for underlying cause of condition will improve Outcome: Progressing   Problem: Physical Regulation: Goal: Ability to maintain clinical measurements within normal limits will improve Outcome: Progressing   Problem: Safety: Goal: Periods of time without injury will increase Outcome: Progressing   Problem: Education: Goal: Ability to state activities that reduce stress will improve Outcome: Progressing   Problem: Coping: Goal: Ability to identify and develop effective coping behavior will improve Outcome: Progressing   Problem: Self-Concept: Goal: Ability to identify factors that promote anxiety will improve Outcome: Progressing Goal: Level of anxiety will decrease Outcome: Progressing Goal: Ability to modify response to factors that promote anxiety will improve Outcome: Progressing

## 2024-02-09 NOTE — Progress Notes (Signed)
   02/09/24 1000  Psych Admission Type (Psych Patients Only)  Admission Status Voluntary  Psychosocial Assessment  Patient Complaints Anxiety  Eye Contact Fair  Facial Expression Anxious  Affect Anxious  Speech Logical/coherent  Interaction Cautious  Motor Activity Restless  Appearance/Hygiene Unremarkable  Behavior Characteristics Cooperative;Anxious  Mood Anxious  Thought Process  Coherency WDL  Content WDL  Delusions None reported or observed  Perception WDL  Hallucination None reported or observed  Judgment Poor  Confusion None  Danger to Self  Current suicidal ideation? Denies  Description of Suicide Plan No Plan  Agreement Not to Harm Self Yes  Description of Agreement Verbal  Danger to Others  Danger to Others None reported or observed

## 2024-02-09 NOTE — Plan of Care (Signed)
   Problem: Education: Goal: Emotional status will improve Outcome: Progressing Goal: Mental status will improve Outcome: Progressing Goal: Verbalization of understanding the information provided will improve Outcome: Progressing

## 2024-02-09 NOTE — Group Note (Signed)
 Date:  02/09/2024 Time:  5:45 AM  Group Topic/Focus:  Wrap-Up Group:   The focus of this group is to help patients review their daily goal of treatment and discuss progress on daily workbooks.    Additional Comments:  Pt was encouraged, but opted out of attending wrap up group this evening.  Sarah Richards 02/09/2024, 5:45 AM

## 2024-02-09 NOTE — BH IP Treatment Plan (Signed)
 Interdisciplinary Treatment and Diagnostic Plan Update  02/09/2024 Time of Session: 10:25 AM Sarah Richards MRN: [id number]  Principal Diagnosis: MDD (major depressive disorder), severe (HCC)  Secondary Diagnoses: Principal Problem:   MDD (major depressive disorder), severe (HCC)   Current Medications:  Current Facility-Administered Medications  Medication Dose Route Frequency Provider Last Rate Last Admin   acetaminophen  (TYLENOL ) tablet 650 mg  650 mg Oral Q6H PRN Coleman, Carolyn H, NP       alum & mag hydroxide-simeth (MAALOX/MYLANTA) 200-200-20 MG/5ML suspension 30 mL  30 mL Oral Q4H PRN Coleman, Carolyn H, NP       haloperidol (HALDOL) tablet 5 mg  5 mg Oral TID PRN Mardy Elveria DEL, NP       And   diphenhydrAMINE  (BENADRYL ) capsule 50 mg  50 mg Oral TID PRN Mardy Elveria DEL, NP       haloperidol lactate (HALDOL) injection 5 mg  5 mg Intramuscular TID PRN Mardy Elveria DEL, NP       And   diphenhydrAMINE  (BENADRYL ) injection 50 mg  50 mg Intramuscular TID PRN Mardy Elveria DEL, NP       And   LORazepam  (ATIVAN ) injection 2 mg  2 mg Intramuscular TID PRN Mardy Elveria DEL, NP       haloperidol lactate (HALDOL) injection 10 mg  10 mg Intramuscular TID PRN Mardy Elveria DEL, NP       And   diphenhydrAMINE  (BENADRYL ) injection 50 mg  50 mg Intramuscular TID PRN Mardy Elveria DEL, NP       And   LORazepam  (ATIVAN ) injection 2 mg  2 mg Intramuscular TID PRN Mardy Elveria DEL, NP       DULoxetine (CYMBALTA) DR capsule 30 mg  30 mg Oral Daily McCarty, Artie, MD   30 mg at 02/09/24 0847   hydrOXYzine (ATARAX) tablet 50 mg  50 mg Oral TID PRN McCarty, Artie, MD   50 mg at 02/09/24 1552   magnesium hydroxide (MILK OF MAGNESIA) suspension 30 mL  30 mL Oral Daily PRN Mardy Elveria DEL, NP       traZODone (DESYREL) tablet 50 mg  50 mg Oral QHS PRN Coleman, Carolyn H, NP   50 mg at 02/08/24 2145   triamcinolone cream (KENALOG) 0.1 % cream   Topical TID Pashayan, Alexander S, DO        [START ON 02/10/2024] Vitamin D (Ergocalciferol) (DRISDOL) 1.25 MG (50000 UNIT) capsule 50,000 Units  50,000 Units Oral Q7 days McCarty, Artie, MD       white petrolatum  (VASELINE) gel   Topical PRN McCarty, Artie, MD       PTA Medications: No medications prior to admission.    Patient Stressors: Health problems    Patient Strengths: Manufacturing systems engineer  Supportive family/friends   Treatment Modalities: Medication Management, Group therapy, Case management,  1 to 1 session with clinician, Psychoeducation, Recreational therapy.   Physician Treatment Plan for Primary Diagnosis: MDD (major depressive disorder), severe (HCC) Long Term Goal(s):     Short Term Goals:    Medication Management: Evaluate patient's response, side effects, and tolerance of medication regimen.  Therapeutic Interventions: 1 to 1 sessions, Unit Group sessions and Medication administration.  Evaluation of Outcomes: Not Progressing  Physician Treatment Plan for Secondary Diagnosis: Principal Problem:   MDD (major depressive disorder), severe (HCC)  Long Term Goal(s):     Short Term Goals:       Medication Management: Evaluate patient's response, side effects, and tolerance of medication regimen.  Therapeutic Interventions: 1 to 1 sessions, Unit Group sessions and Medication administration.  Evaluation of Outcomes: Not Progressing   RN Treatment Plan for Primary Diagnosis: MDD (major depressive disorder), severe (HCC) Long Term Goal(s): Knowledge of disease and therapeutic regimen to maintain health will improve  Short Term Goals: Ability to remain free from injury will improve, Ability to verbalize frustration and anger appropriately will improve, Ability to demonstrate self-control, Ability to participate in decision making will improve, Ability to verbalize feelings will improve, Ability to disclose and discuss suicidal ideas, Ability to identify and develop effective coping behaviors will improve,  and Compliance with prescribed medications will improve  Medication Management: RN will administer medications as ordered by provider, will assess and evaluate patient's response and provide education to patient for prescribed medication. RN will report any adverse and/or side effects to prescribing provider.  Therapeutic Interventions: 1 on 1 counseling sessions, Psychoeducation, Medication administration, Evaluate responses to treatment, Monitor vital signs and CBGs as ordered, Perform/monitor CIWA, COWS, AIMS and Fall Risk screenings as ordered, Perform wound care treatments as ordered.  Evaluation of Outcomes: Not Progressing   LCSW Treatment Plan for Primary Diagnosis: MDD (major depressive disorder), severe (HCC) Long Term Goal(s): Safe transition to appropriate next level of care at discharge, Engage patient in therapeutic group addressing interpersonal concerns.  Short Term Goals: Engage patient in aftercare planning with referrals and resources, Increase social support, Increase ability to appropriately verbalize feelings, Increase emotional regulation, Facilitate acceptance of mental health diagnosis and concerns, Facilitate patient progression through stages of change regarding substance use diagnoses and concerns, Identify triggers associated with mental health/substance abuse issues, and Increase skills for wellness and recovery  Therapeutic Interventions: Assess for all discharge needs, 1 to 1 time with Social worker, Explore available resources and support systems, Assess for adequacy in community support network, Educate family and significant other(s) on suicide prevention, Complete Psychosocial Assessment, Interpersonal group therapy.  Evaluation of Outcomes: Not Progressing   Progress in Treatment: Attending groups: No. Participating in groups: No. Taking medication as prescribed: Yes. Toleration medication: Yes. Family/Significant other contact made: No, will contact:   Mother, Sarah Richards [phone] Patient understands diagnosis: Yes. Discussing patient identified problems/goals with staff: Yes. Medical problems stabilized or resolved: Yes. Denies suicidal/homicidal ideation: Yes. Issues/concerns per patient self-inventory: No.  New problem(s) identified:  No  New Short Term/Long Term Goal(s):     medication stabilization, elimination of SI thoughts, development of comprehensive mental wellness plan.    Patient Goals:  I want to go home.   I don't want to feel anxious.  I need to get a primary care doctor and a rheumatologist.     Discharge Plan or Barriers:  Patient recently admitted. CSW will continue to follow and assess for appropriate referrals and possible discharge planning.     Reason for Continuation of Hospitalization: Depression Medication stabilization Suicidal ideation  Estimated Length of Stay: 5 - 7 days  Last 3 [place] Suicide Severity Risk Score: Flowsheet Row [facility] (Current) [facility] from [facility]/07/2024 [facility]/03/2023 in [facility] RISK CATEGORY High Risk High Risk No Risk    Last PHQ 2/9 Scores:     No data to display          Scribe for Treatment Team: Evanna Washinton O Kelaiah Escalona, LCSWA 02/09/2024 7:16 PM

## 2024-02-09 NOTE — [facility] Group Notes (Signed)
 Psychoeducational Group Note  Date:  02/09/2024 Time:  2000  Group Topic/Focus:  Narcotics Anonymous Meeting  Participation Level: Did Not Attend  Participation Quality:  Not Applicable  Affect:  Not Applicable  Cognitive:  Not Applicable  Insight:  Not Applicable  Engagement in Group: Not Applicable  Additional Comments:  Did not attend.   Lenora Shaver S 02/09/2024, 9:22 PM
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
## 2024-02-09 NOTE — Group Note (Signed)
 Date:  02/09/2024 Time:  5:17 PM  Group Topic/Focus: Emotional Regulation - Cooking Up Calm Emotional Education:   The focus of this group is to discuss what feelings/emotions are, and how they are experienced. Managing Feelings:   The focus of this group is to identify what feelings patients have difficulty handling and develop a plan to handle them in a healthier way upon discharge. Group Type: Psychoeducational / Skills-Based Group Objective:  Support participants in identifying, understanding, and managing emotions using a structured, relatable approach. Group Summary: Icebreaker: Mood Menu Discussed emotional regulation as a recipe: Name it, Tame it, Frame it Explored emotional triggers and body cues Played Emotion Charades Completed Emotional Regulation Recipe worksheet Shared coping strategies and prep steps Closed with group reflection Skills Practiced: Emotional awareness Coping strategy selection Peer engagement       Additional Comments:  patient did not attend   Sylvain Hasten N Deloros Beretta 02/09/2024, 5:17 PM

## 2024-02-09 NOTE — Group Note (Signed)
 Date:  02/09/2024 Time:  9:51 AM  Group Topic/Focus:  Emotional Education:   The focus of this group is to discuss what feelings/emotions are, and how they are experienced. Goals Group:   The focus of this group is to help patients establish daily goals to achieve during treatment and discuss how the patient can incorporate goal setting into their daily lives to aide in recovery. Orientation:   The focus of this group is to educate the patient on the purpose and policies of crisis stabilization and provide a format to answer questions about their admission.  The group details unit policies and expectations of patients while admitted.    Participation Level:  Did Not Attend   Sarah Richards 02/09/2024, 9:51 AM

## 2024-02-09 NOTE — Progress Notes (Signed)
   02/09/24 1018  Charting Type  Charting Type Shift assessment  Safety Check Verification  Has the RN verified the 15 minute safety check completion? Yes  Neurological  Neuro (WDL) WDL  HEENT  HEENT (WDL) WDL  Respiratory  Respiratory (WDL) WDL  Cardiac  Cardiac (WDL) WDL  Vascular  Vascular (WDL) WDL  Integumentary  Integumentary (WDL) X (No new changes)  Braden Scale (Ages 8 and up)  Sensory Perceptions 4  Moisture 4  Activity 3  Mobility 4  Nutrition 3  Friction and Shear 3  Braden Scale Score 21  Musculoskeletal  Musculoskeletal (WDL) WDL  Assistive Device None  Gastrointestinal  Gastrointestinal (WDL) WDL  GU Assessment  Genitourinary (WDL) WDL  Neurological  Level of Consciousness Alert
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
## 2024-02-09 NOTE — Progress Notes (Signed)

## 2024-02-10 DIAGNOSIS — F329 Major depressive disorder, single episode, unspecified: Secondary | ICD-10-CM | POA: Diagnosis not present

## 2024-02-10 LAB — HIGH SENSITIVITY CRP: CRP, High Sensitivity: 0.18 mg/L (ref 0.00–3.00)

## 2024-02-10 LAB — RHEUMATOID FACTOR: Rheumatoid fact SerPl-aCnc: <10 [IU]/mL (ref <14.0)

## 2024-02-10 NOTE — Group Note (Signed)
 Recreation Therapy Group Note   Group Topic:Other  Group Date: 02/10/2024 Start Time: 1304 End Time: 1350 Facilitators: Christyl Osentoski-McCall, LRT,CTRS Location: [address] Dayroom   Activity Description/Intervention: Therapeutic Drumming. Patients with peers and staff were given the opportunity to engage in a leader facilitated [facility] Group Empowerment Drumming Circle with staff from the [employer], in partnership with [employer]. Teaching laboratory technician and trained [employer], Norleen Mon leading with LRT observing and documenting intervention and pt response. This evidenced-based practice targets 7 areas of health and wellbeing in the human experience including: stress-reduction, exercise, self-expression, camaraderie/support, nurturing, spirituality, and music-making (leisure).   Goal Area(s) Addresses:  Patient will engage in pro-social way in music group.  Patient will follow directions of drum leader on the first prompt. Patient will demonstrate no behavioral issues during group.  Patient will identify if a reduction in stress level occurs as a result of participation in therapeutic drum circle.    Education: Leisure exposure, Pharmacologist, Musical expression, Discharge Planning   Affect/Mood: N/A   Participation Level: Did not attend    Clinical Observations/Individualized Feedback:     Plan: Continue to engage patient in RT group sessions 2-3x/week.   Carleena Mires-McCall, LRT,CTRS 02/10/2024 4:16 PM

## 2024-02-10 NOTE — Progress Notes (Addendum)
 D. Pt has been appropriate during interactions, described her sleep, concentration and appetite as 'good', and energy level as 'normal'. Per pt's self inventory, pt rated her depression, hopelessness and anxiety a 0/0/5, respectively. Pt's stated goal today is going home. Pt currently denies SI/HI and AVH and doesn't appear to be responding to internal stimuli.  A. Labs and vitals monitored. Pt given and educated on medications. Pt supported emotionally and encouraged to express concerns and ask questions.   R. Pt remains safe with 15 minute checks. Will continue POC.    02/10/24 1600  Psych Admission Type (Psych Patients Only)  Admission Status Voluntary  Psychosocial Assessment  Patient Complaints Anxiety  Eye Contact Fair  Facial Expression Anxious  Affect Appropriate to circumstance  Speech Logical/coherent  Interaction Assertive  Motor Activity Other (Comment) (steady gait)  Appearance/Hygiene Unremarkable  Behavior Characteristics Cooperative;Anxious  Mood Anxious;Pleasant  Aggressive Behavior  Effect No apparent injury  Thought Process  Coherency WDL  Content WDL  Delusions None reported or observed  Perception WDL  Hallucination None reported or observed  Judgment Poor  Confusion None  Danger to Self  Current suicidal ideation? Denies  Danger to Others  Danger to Others None reported or observed

## 2024-02-10 NOTE — Plan of Care (Signed)
   Problem: Education: Goal: Emotional status will improve Outcome: Progressing Goal: Mental status will improve Outcome: Progressing Goal: Verbalization of understanding the information provided will improve Outcome: Progressing   Problem: Activity: Goal: Interest or engagement in activities will improve Outcome: Progressing

## 2024-02-10 NOTE — Plan of Care (Signed)
   Problem: Education: Goal: Emotional status will improve Outcome: Progressing Goal: Mental status will improve Outcome: Progressing

## 2024-02-10 NOTE — Progress Notes (Signed)
(  Sleep Hours) -8.75 (Any PRNs that were needed, meds refused, or side effects to meds)- Trazodone and Atarax (Any disturbances and when (visitation, over night)-none (Concerns raised by the patient)- none (SI/HI/AVH)-denied

## 2024-02-10 NOTE — [facility] Group Notes (Signed)
 Psychoeducational Group Note  Date:  02/10/2024 Time:  2000  Group Topic/Focus:  Wrap up group  Participation Level: Did Not Attend  Participation Quality:  Not Applicable  Affect:  Not Applicable  Cognitive:  Not Applicable  Insight:  Not Applicable  Engagement in Group: Not Applicable  Additional Comments:  Did not attend.   Lenora Manuelita RAMAN 02/10/2024, 9:19 PM

## 2024-02-10 NOTE — Progress Notes (Signed)
   02/10/24 2100  Psych Admission Type (Psych Patients Only)  Admission Status Voluntary  Psychosocial Assessment  Patient Complaints None  Eye Contact Fair  Facial Expression Animated  Affect Appropriate to circumstance  Speech Logical/coherent  Interaction Assertive  Motor Activity Other (Comment) (WDL)  Appearance/Hygiene Unremarkable  Behavior Characteristics Cooperative  Mood Pleasant  Thought Process  Coherency WDL  Content WDL  Delusions None reported or observed  Perception WDL  Hallucination None reported or observed  Judgment Poor  Confusion None  Danger to Self  Current suicidal ideation? Denies  Agreement Not to Harm Self Yes  Description of Agreement Verbal  Danger to Others  Danger to Others None reported or observed

## 2024-02-10 NOTE — Progress Notes (Addendum)
 [facility] MD Progress Note  02/10/2024 10:28 AM Sarah Richards  MRN:  [id number] Subjective:   Sarah Richards is a 23 yr old female who presented on 10/6 to [facility] for SI due to concerns over her skin, she was admitted to [facility] on 10/7.  PPHx is not significant for prior Diagnosis', Suicide Attempts, Self Injurious Behavior or Psychiatric Hospitalizations.   Case was discussed in the multidisciplinary team. MAR was reviewed and patient was compliant with medications.  She received PRN Hydroxyzine and Trazodone last night.   Psychiatric Team made the following recommendations yesterday: -- Start triamcinolone ointment 3 times daily for severe rashes/plaques and pruritus -- Start hydroxyzine 50 mg 3 times daily as needed for pruritus, anxiety -- start vaseline for dry skin -- Start vitamin D 50,000 units once weekly for vitamin D insufficiency   On interview today patient reports she slept good last night.  She reports her appetite is doing good.  She reports no SI, HI, or AVH.  She reports no Paranoia or Ideas of Reference.  She reports no issues with her medications.  She reports that with the Kenalog and vaseline her skin has been doing better. Discussed with her that Social Work has scheduled a PCP appointment and from there an appointment for Rheumatology could be made and she was thankful for this.  She reports no other concerns at present.   Principal Problem: MDD (major depressive disorder), severe (HCC) Diagnosis: Principal Problem:   MDD (major depressive disorder), severe (HCC)  Total Time spent with patient:  I personally spent 35 minutes on the unit in direct patient care. The direct patient care time included face-to-face time with the patient, reviewing the patient's chart, communicating with other professionals, and coordinating care.    Past Psychiatric History:  not significant for prior Diagnosis', Suicide Attempts, Self Injurious Behavior or Psychiatric Hospitalizations.  Past  Medical History:  Past Medical History:  Diagnosis Date   Eczema    Medical history non-contributory     Past Surgical History:  Procedure Laterality Date   ANTERIOR CERVICAL DECOMP/DISCECTOMY FUSION N/A 09/30/2020   Procedure: ANTERIOR CERVICAL DECOMPRESSION/DISCECTOMY FUSION CERVICAL THREE TO SIX;  Surgeon: Cheryle Debby LABOR, MD;  Location: [facility] OR;  Service: Neurosurgery;  Laterality: N/A;   HERNIA REPAIR     Family History:  Family History  Adopted: Yes   Family Psychiatric  History:  She was adopted. Little is known about her biologic parents except that her mother had a substance use disorder.   Social History:  Social History   Substance and Sexual Activity  Alcohol Use Not Currently     Social History   Substance and Sexual Activity  Drug Use Yes   Frequency: 7.0 times per week   Types: Marijuana   Comment: States she smokes maijuana daily typically    Social History   Socioeconomic History   Marital status: Single    Spouse name: Not on file   Number of children: Not on file   Years of education: Not on file   Highest education level: Not on file  Occupational History   Not on file  Tobacco Use   Smoking status: Never   Smokeless tobacco: Never  Vaping Use   Vaping status: Never Used  Substance and Sexual Activity   Alcohol use: Not Currently   Drug use: Yes    Frequency: 7.0 times per week    Types: Marijuana    Comment: States she smokes maijuana daily typically   Sexual activity:  Not Currently  Other Topics Concern   Not on file  Social History Narrative   Not on file   Social Drivers of Health   Financial Resource Strain: Not on file  Food Insecurity: No Food Insecurity (02/08/2024)   Hunger Vital Sign    Worried About Running Out of Food in the Last Year: Never true    Ran Out of Food in the Last Year: Never true  Transportation Needs: No Transportation Needs (02/08/2024)   PRAPARE - Administrator, Civil Service  (Medical): No    Lack of Transportation (Non-Medical): No  Physical Activity: Not on file  Stress: Not on file  Social Connections: Not on file   Additional Social History:                         Sleep: Good Estimated Sleeping Duration (Last 24 Hours): 7.25-9.00 hours  Appetite:  Good  Current Medications: Current Facility-Administered Medications  Medication Dose Route Frequency Provider Last Rate Last Admin   acetaminophen  (TYLENOL ) tablet 650 mg  650 mg Oral Q6H PRN Coleman, Carolyn H, NP       alum & mag hydroxide-simeth (MAALOX/MYLANTA) 200-200-20 MG/5ML suspension 30 mL  30 mL Oral Q4H PRN Coleman, Carolyn H, NP       haloperidol (HALDOL) tablet 5 mg  5 mg Oral TID PRN Mardy Elveria DEL, NP       And   diphenhydrAMINE  (BENADRYL ) capsule 50 mg  50 mg Oral TID PRN Mardy Elveria DEL, NP       haloperidol lactate (HALDOL) injection 5 mg  5 mg Intramuscular TID PRN Mardy Elveria DEL, NP       And   diphenhydrAMINE  (BENADRYL ) injection 50 mg  50 mg Intramuscular TID PRN Mardy Elveria DEL, NP       And   LORazepam  (ATIVAN ) injection 2 mg  2 mg Intramuscular TID PRN Mardy Elveria DEL, NP       haloperidol lactate (HALDOL) injection 10 mg  10 mg Intramuscular TID PRN Mardy Elveria DEL, NP       And   diphenhydrAMINE  (BENADRYL ) injection 50 mg  50 mg Intramuscular TID PRN Mardy Elveria DEL, NP       And   LORazepam  (ATIVAN ) injection 2 mg  2 mg Intramuscular TID PRN Mardy Elveria DEL, NP       DULoxetine (CYMBALTA) DR capsule 30 mg  30 mg Oral Daily McCarty, Artie, MD   30 mg at 02/10/24 0804   hydrOXYzine (ATARAX) tablet 50 mg  50 mg Oral TID PRN McCarty, Artie, MD   50 mg at 02/10/24 9373   magnesium hydroxide (MILK OF MAGNESIA) suspension 30 mL  30 mL Oral Daily PRN Mardy Elveria DEL, NP       traZODone (DESYREL) tablet 50 mg  50 mg Oral QHS PRN Coleman, Carolyn H, NP   50 mg at 02/09/24 2113   triamcinolone cream (KENALOG) 0.1 % cream   Topical TID Jasani Dolney,  Rissie Sculley S, DO   1 Application at 02/10/24 0805   Vitamin D (Ergocalciferol) (DRISDOL) 1.25 MG (50000 UNIT) capsule 50,000 Units  50,000 Units Oral Q7 days McCarty, Artie, MD   50,000 Units at 02/10/24 9195   white petrolatum  (VASELINE) gel   Topical PRN McCarty, Artie, MD        Lab Results:  Results for orders placed or performed during the hospital encounter of 02/08/24 (from the past 48 hours)  Sedimentation  rate     Status: None   Collection Time: 02/09/24  6:35 AM  Result Value Ref Range   Sed Rate 6 0 - 22 mm/hr    Comment: Performed at [facility], [address], [place], [place] [address]  High sensitivity CRP     Status: None   Collection Time: 02/09/24  6:35 AM  Result Value Ref Range   CRP, High Sensitivity 0.18 0.00 - 3.00 mg/L    Comment: (NOTE)         Relative Risk for Future Cardiovascular Event                             Low                 <1.00                             Average       1.00 - 3.00                             High                >3.00 Performed At: [facility] [address] [place], [place] [id number] Jennette Shorter MD Ey:[id number]   Rheumatoid factor     Status: None   Collection Time: 02/09/24  6:35 AM  Result Value Ref Range   Rheumatoid fact SerPl-aCnc <10.0 <14.0 IU/mL    Comment: (NOTE) Performed At: [facility] [address] [place], [place] [id number] Jennette Shorter MD Ey:[id number]   TSH     Status: None   Collection Time: 02/09/24  6:35 AM  Result Value Ref Range   TSH 0.910 0.350 - 4.500 uIU/mL    Comment: Performed at [facility], [address], [place], [place] [address]  VITAMIN D 25 Hydroxy (Vit-D Deficiency, Fractures)     Status: Abnormal   Collection Time: 02/09/24  6:35 AM  Result Value Ref Range   Vit D, 25-Hydroxy 21.68 (L) 30 - 100 ng/mL    Comment: (NOTE) Vitamin D deficiency has been defined by the Institute of Medicine  and an Endocrine Society  practice guideline as a level of serum 25-OH  vitamin D less than 20 ng/mL (1,2). The Endocrine Society went on to  further define vitamin D insufficiency as a level between 21 and 29  ng/mL (2).  1. IOM (Institute of Medicine). 2010. Dietary reference intakes for  calcium and D. [place]: [employer]. 2. Holick MF, Binkley [place], Bischoff-Ferrari HA, et al. Evaluation,  treatment, and prevention of vitamin D deficiency: an Endocrine  Society clinical practice guideline, JCEM. 2011 Jul; 96(7): 1911-30.  Performed at [facility], [address], [place], [place] [address]     Blood Alcohol level:  Lab Results  Component Value Date   [facility] <15 02/07/2024   ETH 82 (H) 09/30/2020    Metabolic Disorder Labs: No results found for: HGBA1C, MPG No results found for: PROLACTIN No results found for: CHOL, TRIG, HDL, CHOLHDL, VLDL, LDLCALC  Physical Findings: AIMS:  ,  ,  ,  ,  ,  ,   CIWA:    COWS:     Musculoskeletal: Strength & Muscle Tone: within normal limits Gait & Station: normal Patient leans: N/A  Psychiatric Specialty  Exam:  Presentation  General Appearance:  Appropriate for Environment; Casual  Eye Contact: Good  Speech: Clear and Coherent; Normal Rate  Speech Volume: Normal  Handedness:No data recorded  Mood and Affect  Mood: Euthymic  Affect: Congruent; Appropriate   Thought Process  Thought Processes: Coherent; Goal Directed  Descriptions of Associations:Intact  Orientation:Full (Time, Place and Person)  Thought Content:Logical; WDL  History of Schizophrenia/Schizoaffective disorder:No data recorded Duration of Psychotic Symptoms:No data recorded Hallucinations:Hallucinations: None  Ideas of Reference:None  Suicidal Thoughts:Suicidal Thoughts: No  Homicidal Thoughts:Homicidal Thoughts: No   Sensorium  Memory: Immediate Fair; Recent Fair  Judgment: Fair  Insight: Fair   Restaurant manager, fast food  Concentration: Good  Attention Span: Good  Recall: Good  Fund of Knowledge: Good  Language: Good   Psychomotor Activity  Psychomotor Activity: Psychomotor Activity: Normal   Assets  Assets: Communication Skills; Desire for Improvement; Resilience; Housing; Social Support   Sleep  Sleep: Sleep: Good    Physical Exam: Physical Exam Vitals and nursing note reviewed.  Constitutional:      General: She is not in acute distress.    Appearance: Normal appearance. She is normal weight. She is not ill-appearing or toxic-appearing.  HENT:     Head: Normocephalic and atraumatic.  Pulmonary:     Effort: Pulmonary effort is normal.  Musculoskeletal:        General: Normal range of motion.  Neurological:     General: No focal deficit present.     Mental Status: She is alert.    Review of Systems  Respiratory:  Negative for cough and shortness of breath.   Cardiovascular:  Negative for chest pain.  Gastrointestinal:  Negative for abdominal pain, constipation, diarrhea, nausea and vomiting.  Neurological:  Negative for dizziness, weakness and headaches.  Psychiatric/Behavioral:  Negative for depression, hallucinations and suicidal ideas. The patient is not nervous/anxious.    Blood pressure 105/82, pulse (!) 54, temperature 97.7 F (36.5 C), temperature source Oral, resp. rate 16, height 5' 2 (1.575 m), weight 53.7 kg, last menstrual period 02/07/2024, SpO2 100%, unknown if currently breastfeeding. Body mass index is 21.66 kg/m.   Treatment Plan Summary: Daily contact with patient to assess and evaluate symptoms and progress in treatment and Medication management  Sarah Richards is a 23 yr old female who presented on 10/6 to [facility] for SI due to concerns over her skin, she was admitted to [facility] on 10/7.  PPHx is not significant for prior Diagnosis', Suicide Attempts, Self Injurious Behavior or Psychiatric Hospitalizations.    Sarah Richards has tolerated the  Cymbalta well.  If she continues to do well we will plan for discharge tomorrow.  We will not make any changes to her medications at this time.  We will continue to monitor.     Safety and Monitoring:             -- Voluntary admission to inpatient psychiatric unit for safety, stabilization and treatment             -- Daily contact with patient to assess and evaluate symptoms and progress in treatment             -- Patient's case to be discussed in multi-disciplinary team meeting             -- Observation Level : q15 minute checks             -- Vital signs:  q12 hours             --  Precautions: suicide, elopement, and assault   2. Psychiatric Treatment:              -- Continue Cymbalta 30 mg once daily for MDD, neuropathic pain; if still tolerating well consider increasing on 10/9 to 60 mg    -- Continue trazodone 50 mg once nightly as needed for insomnia             -- Continue hydroxyzine 50 mg 3 times daily as needed for anxiety/prutitis   --  The risks/benefits/side-effects/alternatives to this medication were discussed in detail with the patient and time was given for questions. The patient consents to medication trial.              -- Metabolic profile and EKG monitoring obtained while on an atypical antipsychotic. See #4 below for values.              -- Encouraged patient to participate in unit milieu and in scheduled group therapies              -- Short Term Goals: Ability to identify changes in lifestyle to reduce recurrence of condition will improve, Ability to verbalize feelings will improve, Ability to disclose and discuss suicidal ideas, Ability to demonstrate self-control will improve, Ability to identify and develop effective coping behaviors will improve, Ability to maintain clinical measurements within normal limits will improve, Compliance with prescribed medications will improve, and Ability to identify triggers associated with substance abuse/mental health issues will  improve             -- Long Term Goals: Improvement in symptoms so as ready for discharge                3. Medical Issues Being Addressed:              -- Start triamcinolone ointment 3 times daily for severe rashes/plaques and pruritus             -- Continue hydroxyzine 50 mg 3 times daily as needed for pruritus, anxiety             -- Continue vaseline for dry skin             -- Continue vitamin D 50,000 units once weekly for vitamin D insufficiency               -- Continue PRN's: Tylenol , Maalox, Milk of Magnesia, traz,      4. Routine and other pertinent labs reviewed: EKG monitoring: QTc: 422, NSR (EKG has artifact)   Metabolism / endocrine: BMI: Body mass index is 21.66 kg/m.   TSH: Last Labs     TSH (uIU/mL)  Date Value  02/09/2024 0.910      Vitamin D: 21.68 low ESR: 6 normal CRP: Pending Rheumatoid factor: Pending CCP: Pending     5. Discharge Planning:              -- Social work and case management to assist with discharge planning and identification of hospital follow-up needs prior to discharge             -- Estimated LOS: 1010-1/12             -- Discharge Concerns: Need to establish a safety plan; Medication compliance and effectiveness             -- Discharge Goals: Return home with outpatient referrals for mental health follow-up including medication management/psychotherapy     I certify that inpatient services  furnished can reasonably be expected to improve the patient's condition.       Marsa GORMAN Rosser, DO 02/10/2024, 10:28 AM

## 2024-02-10 NOTE — Group Note (Signed)
 LCSW Group Therapy Note   Group Date: 02/10/2024 Start Time: 1100 End Time: 1200   Participation:  did not attend  Type of Therapy:  Group Therapy  Topic:  Understanding Your Path to Change  Objective:  The goal is to help individuals understand the stages of change, identify where they currently are in the process, and provide actionable next steps to continue moving forward in their journey of change.  Goals: Learn about the six stages of change:  Precontemplation, Contemplation, Preparation, Action, Maintenance, and Relapse Reflect on Current Change Efforts:  Recognize which stage participants are in regarding a personal change. Plan Next Steps for Moving Forward:  Create an action plan based on their current stage of change.  Summary:  In this session, we explored the Stages of Change as a framework to understand the process of change.  We discussed how each stage helps individuals recognize where they are in their personal journey and used the Stages of Change Worksheet for self-reflection. Participants answered questions to better understand their current stage, challenges, and progress. We also emphasized the importance of moving forward, even if setbacks (Relapse) occur, and created actionable steps to help participants continue progressing. By the end of the session, participants gained a clearer understanding of their path to change and left with a clear plan for next steps.  Therapeutic Modalities:  Elements of CBT (cognitive restructuring, problem solving)  Element of DBT (mindfulness, distress tolerance)   Giorgio Chabot O Hurley Sobel, LCSWA 02/10/2024  12:23 PM

## 2024-02-10 NOTE — Group Note (Signed)
 Date:  02/10/2024 Time:  10:29 AM  Group Topic/Focus:  Goals Group:   The focus of this group is to help patients establish daily goals to achieve during treatment and discuss how the patient can incorporate goal setting into their daily lives to aide in recovery.    Participation Level:  Did Not Attend  Participation Quality:  Did Not Attend  Affect:  Did Not Attend  Cognitive:  Did Not Attend  Insight: None  Engagement in Group:  Did Not Attend  Modes of Intervention:  Did Not Attend  Additional Comments:  Did Not Attend  Issabella Rix A Winfrey Chillemi 02/10/2024, 10:29 AM

## 2024-02-11 ENCOUNTER — Other Ambulatory Visit (HOSPITAL_COMMUNITY): Payer: Self-pay

## 2024-02-11 DIAGNOSIS — F329 Major depressive disorder, single episode, unspecified: Secondary | ICD-10-CM | POA: Diagnosis not present

## 2024-02-11 LAB — CYCLIC CITRUL PEPTIDE ANTIBODY, IGG/IGA: CCP Antibodies IgG/IgA: 7 U (ref 0–19)

## 2024-02-11 MED ORDER — DULOXETINE HCL 30 MG PO CPEP
30.0000 mg | ORAL_CAPSULE | Freq: Every day | ORAL | 0 refills | Status: AC
  Filled 2024-02-11: qty 30, 30d supply, fill #0

## 2024-02-11 MED ORDER — VITAMIN D (ERGOCALCIFEROL) 1.25 MG (50000 UNIT) PO CAPS
50000.0000 [IU] | ORAL_CAPSULE | ORAL | 0 refills | Status: AC
  Filled 2024-02-11: qty 5, 35d supply, fill #0

## 2024-02-11 MED ORDER — TRIAMCINOLONE ACETONIDE 0.1 % EX CREA
TOPICAL_CREAM | Freq: Three times a day (TID) | CUTANEOUS | 0 refills | Status: AC
  Filled 2024-02-11: qty 454, 30d supply, fill #0

## 2024-02-11 MED ORDER — HYDROXYZINE HCL 50 MG PO TABS
50.0000 mg | ORAL_TABLET | Freq: Three times a day (TID) | ORAL | 0 refills | Status: AC | PRN
  Filled 2024-02-11: qty 90, 30d supply, fill #0

## 2024-02-11 MED ORDER — TRAZODONE HCL 50 MG PO TABS
50.0000 mg | ORAL_TABLET | Freq: Every evening | ORAL | 0 refills | Status: AC | PRN
  Filled 2024-02-11: qty 30, 30d supply, fill #0
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
## 2024-02-11 NOTE — Discharge Instructions (Signed)
-  Follow-up with your outpatient psychiatric provider (and therapist) -instructions on appointment date, time, and address (location) are provided to you in discharge paperwork.  -Take your psychiatric medications as prescribed at discharge - instructions are provided to you in the discharge paperwork  -Follow-up with outpatient primary care doctor and other specialists -for management of preventative medicine and any chronic medical disease.  -Recommend abstinence from alcohol, tobacco, cannabis, and other substances at discharge.   -If your psychiatric symptoms recur, worsen, or if you have severe side effects to your psychiatric medications, call your outpatient psychiatric provider, 911, 988 (national suicide hotline), go to [facility], or go to the nearest emergency department.  -If suicidal thoughts occur, call your outpatient psychiatric provider, 911, 988 (national suicide hotline), go to [facility], or go to the nearest emergency department.  Naloxone (Narcan) can help reverse an overdose when given to the victim quickly.  [facility] offers free naloxone kits and instructions/training on its use.  Add naloxone to your first aid kit and you can help save a life.   Pick up your free kit at the following locations:   [place]:  [facility] Division of [facility], [address] [place] [address] [phone]) [facility] [address] [place] [address] [phone]) [facility] [address] [place] [address]  [place]: [facility] of [facility] [address] [place] [address] ([phone]) [facility] [address] [place] [address] [phone])

## 2024-02-11 NOTE — Group Note (Signed)
 Recreation Therapy Group Note   Group Topic:General Recreation  Group Date: 02/11/2024 Start Time: 0930 End Time: 1000 Facilitators: Dawnisha Marquina-McCall, LRT,CTRS Location: [address] Dayroom   Group Topic/Focus: General Recreation   Goal Area(s) Addresses:  Patient will use appropriate interactions with peers.   Patient will work together to come up with answers.  Behavioral Response:   Intervention: Worksheet, Music  Activity: Dentist. Patients were given a worksheet (front and back) of brain teasers. Patients could work together to figure out each individual puzzle presented. While working, LRT played music. LRT and patients went over the answers when patients completed the assignment.     Affect/Mood: N/A   Participation Level: Did not attend    Clinical Observations/Individualized Feedback:     Plan: Continue to engage patient in RT group sessions 2-3x/week.   Sarah Richards, LRT,CTRS 02/11/2024 11:21 AM

## 2024-02-11 NOTE — Progress Notes (Signed)
(  Sleep Hours) - 7.25 (Any PRNs that were needed, meds refused, or side effects to meds)- PRN vistaril 25 mg and trazodone 50 mg given at pt request, no meds refused.  (Any disturbances and when (visitation, over night)- None  (Concerns raised by the patient)- None  (SI/HI/AVH)- Denies SI/HI/AVH

## 2024-02-11 NOTE — [facility] Suicide Risk Assessment (Signed)
 [facility] INPATIENT:  Family/Significant Other Suicide Prevention Education  Suicide Prevention Education:  Education Completed; Mother, Mirayah Wren [phone],  (name of family member/significant other) has been identified by the patient as the family member/significant other with whom the patient will be residing, and identified as the person(s) who will aid the patient in the event of a mental health crisis (suicidal ideations/suicide attempt).  With written consent from the patient, the family member/significant other has been provided the following suicide prevention education, prior to the and/or following the discharge of the patient.  No safety concerns with patient returning home at discharge today. Mother will be here to pick pt up at 1100AM. There are no weapons or firearms in the home.  The suicide prevention education provided includes the following: Suicide risk factors Suicide prevention and interventions National Suicide Hotline telephone number [facility] assessment telephone number [facility] Emergency Assistance 911 [facility] and/or Residential Mobile Crisis Unit telephone number  Request made of family/significant other to: Remove weapons (e.g., guns, rifles, knives), all items previously/currently identified as safety concern.   Remove drugs/medications (over-the-counter, prescriptions, illicit drugs), all items previously/currently identified as a safety concern.  The family member/significant other verbalizes understanding of the suicide prevention education information provided.  The family member/significant other agrees to remove the items of safety concern listed above.  Jenkins LULLA Primer 02/11/2024, 8:16 AM

## 2024-02-11 NOTE — [facility] Suicide Risk Assessment (Signed)
 Suicide Risk Assessment  Discharge Assessment    [facility] Discharge Suicide Risk Assessment   Principal Problem: MDD (major depressive disorder), severe (HCC) Discharge Diagnoses: Principal Problem:   MDD (major depressive disorder), severe (HCC)  Sarah Richards is a 23 y.o. female  with no formal past psychiatric history. Patient initially arrived to [facility] on 02/07/2024 for suicidal thoughts, and admitted to [facility] on 02/08/2024 for acute safety concerns, crisis stabalization, impaired functioning, and intensive therapeutic interventions. 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   Hospital course: Came in with severe skin lesions that were concerning for very severe eczema but seemingly more suspious for psoriasis including pinpoint bleeding and extesnor surfaces, although also had features fo eczema including reported periobital in past. She also reports having joints pains throughotu. She has failed mild corticosteroids in past and was having severe pruritus and was very upset with apperance for skin which was MOSTLY driving her worsening mental illness. She was escalated from mild to moderate corticosteroid with triamcinalone 0.1% which she responded quite well too. She was also given 50 mg hydroxyzine to treat pruritus and anxiety which she found helpful. She was started on cymbalta for her MDD and chronic pain from cervical fusion, as she could benefit from this medication even if her MDD improves with treatment of her skin condition, which we suspect. Pt was set up with therpay. Pt was set up with new PCP given previous one was poorly treating her since condition. Rheumatology labs were drawn and CRP, ESR, and rhematoid factor negative and CCP antibody still pending (of note ESR/CRP only positive in 40% of cases per NEJM article on psoratic arthritis). Also given traz for sleep w/ benefit on a PRN basis but sleep was much better w/  skin condition treatment.   On day of discharge denies SI, HI, AVH. Reports no pruritus and skin is improving. No concerns from patient. Sleeping and eating well. No side effects. Since starting cymbalta,  patient denies common side effects including nausea, dry mouth, insomnia, diarrhea, headache, dizziness, agitation or anxiety, drowsiness, and sexual dysfunction. Found benefit from hydroxyzine and triamcinalone.    Total Time spent with patient: 1 hour  Musculoskeletal: Strength & Muscle Tone: within normal limits Gait & Station: normal Patient leans: N/A  Psychiatric Specialty Exam  Presentation  General Appearance:  Appropriate for Environment; Casual  Eye Contact: Good  Speech: Clear and Coherent; Normal Rate  Speech Volume: Normal    Mood and Affect  Mood: Euthymic   Affect: Congruent; Appropriate   Thought Process  Thought Processes: Coherent; Goal Directed  Descriptions of Associations:Intact  Orientation:Full (Time, Place and Person)  Thought Content:Logical; WDL   Hallucinations:Hallucinations: None  Ideas of Reference:None  Suicidal Thoughts:Suicidal Thoughts: No  Homicidal Thoughts:Homicidal Thoughts: No   Sensorium  Memory: Immediate Fair; Recent Fair  Judgment: Fair  Insight: Fair   Art therapist  Concentration: Good  Attention Span: Good  Recall: Good  Fund of Knowledge: Good  Language: Good   Psychomotor Activity  Psychomotor Activity: Psychomotor Activity: Normal   Assets  Assets: Communication Skills; Desire for Improvement; Resilience; Housing; Social Support   Sleep  Sleep: Sleep: Good  Estimated Sleeping Duration (Last 24 Hours): 6.25-7.50 hours  Physical Exam: Vitals and nursing note reviewed.  Pulmonary:     Effort: Pulmonary effort is normal.  Skin:    Comments: Skin is much improved form admission. No significant dryness. No pinpoint bleeding currently. Mostly contiuing to  involve the surfaces in contac twith clothing butr also services involving folds. No invovlement of face or mucosa.  Neurological:     General: No focal deficit present.     Mental Status: She is alert.     Review of Systems  Constitutional:  Negative for fever.  Cardiovascular:  Negative for chest pain and palpitations.  Gastrointestinal:  Negative for constipation, diarrhea, nausea and vomiting.  Neurological:  Negative for dizziness, weakness and headaches.   Blood pressure 110/80, pulse 73, temperature 97.7 F (36.5 C), temperature source Oral, resp. rate 16, height 5' 2 (1.575 m), weight 53.7 kg, last menstrual period 02/07/2024, SpO2 100%, unknown if currently breastfeeding. Body mass index is 21.66 kg/m.  Mental Status Per Nursing Assessment::   On Admission:  NA  Demographic Factors:  Adolescent or young adult and Unemployed  Loss Factors: Decline in physical health  Historical Factors: NA  Risk Reduction Factors:   Responsible for children under 43 years of age, Sense of responsibility to family, Living with another person, especially a relative, Positive social support, Positive therapeutic relationship, and Positive coping skills or problem solving skills  Continued Clinical Symptoms:  Mood is stable. Anxiety at a manageable level. Denying any SI including passive SI.   Cognitive Features That Contribute To Risk:  None    Suicide Risk:  Mild:  There are no identifiable suicide plans, no associated intent, mild dysphoria and related symptoms, good self-control (both objective and subjective assessment), few other risk factors, and identifiable protective factors, including available and accessible social support.   Follow-up Information     [facility]. Go on 03/22/2024.   Specialty: Family Medicine Why: You have an appointment to establish care for primary care services on 03/22/24 at 10:45 am .  Please request a referral to a  rheumatologist from this provider. Contact information: [address] Hughie Pilsner, [address] [place]  [address] [phone] Additional information: [address]  [address]  [place], [place] [address]    - Parking lot with [employer], [employer], [employer]; Next to [facility]   - [facility] shopping - Off of [address]          [facility] Follow up on 02/17/2024.   Why: You have a hospital follow up appointment for therapy and medication management services on 02/17/24 at 10:30 am.  The appointment will be Virtual, telehealth. Contact information: [address]  [address] [place] [address] [phone]                 Plan Of Care/Follow-up recommendations:  Activity: as tolerated   Diet: heart healthy   Other: -Follow-up with your outpatient psychiatric provider -instructions on appointment date, time, and address (location) are provided to you in discharge paperwork.   -Take your psychiatric medications as prescribed at discharge - instructions are provided to you in the discharge paperwork   -Follow-up with outpatient primary care doctor and other specialists -for management of chronic medical disease, including: eczema v psoriasis. Consider rheumatology referral.    -Testing: Follow-up with outpatient provider for abnormal lab results: rheumatology labs including following up pending CCP antibody.    -Recommend abstinence from alcohol, tobacco, and other illicit drug use at discharge.    -If your psychiatric symptoms recur, worsen, or if you have side effects to your psychiatric medications, call your outpatient psychiatric provider, 911, [phone] or go to the nearest emergency department.   -If suicidal thoughts recur, call your outpatient  psychiatric provider, 911, [phone] or go to the nearest emergency department.    Justino Cornish, MD 02/11/2024, 8:56 AM

## 2024-02-11 NOTE — Progress Notes (Signed)
 Patient discharged to home accompanied by family member. Discharge instructions, all required discharge documents and information about follow-up appointment given to pt with verbalization of understanding. All personal belongings returned to pt at time of discharge. Pt escorted to lobby by RN at 1100.  02/11/24 0910  Psych Admission Type (Psych Patients Only)  Admission Status Voluntary  Psychosocial Assessment  Patient Complaints None  Eye Contact Fair  Facial Expression Animated  Affect Appropriate to circumstance  Speech Logical/coherent  Interaction Assertive  Motor Activity Other (Comment) (WNL)  Appearance/Hygiene Unremarkable  Behavior Characteristics Cooperative  Mood Pleasant  Thought Process  Coherency WDL  Content WDL  Delusions None reported or observed  Perception WDL  Hallucination None reported or observed  Judgment Impaired  Confusion None  Danger to Self  Current suicidal ideation? Denies  Agreement Not to Harm Self Yes  Description of Agreement Verbal  Danger to Others  Danger to Others None reported or observed

## 2024-02-11 NOTE — Progress Notes (Signed)
  [facility] Adult Case Management Discharge Plan :  Will you be returning to the same living situation after discharge:  Yes,  pt returning home at discharge At discharge, do you have transportation home?: Yes,  pt will be picked up at 11 by mother Do you have the ability to pay for your medications: Yes,  pt has active health insurance coverage  Release of information consent forms completed and in the chart;  Patient's signature needed at discharge.  Patient to Follow up at:  Follow-up Information      [facility] at [facility]. Go on 03/22/2024.   Specialty: Family Medicine Why: You have an appointment to establish care for primary care services on 03/22/24 at 10:45 am .  Please request a referral to a rheumatologist from this provider. Contact information: [address] [place]ie Pilsner, [address] [place]  [address] [phone] Additional information: [address]  [address]  [place], [place] [address]    - Parking lot with [employer], [employer], [employer]; Next to [facility]   - [facility] shopping - Off of [address]          [facility] Follow up on 02/17/2024.   Why: You have a hospital follow up appointment for therapy and medication management services on 02/17/24 at 10:30 am.  The appointment will be Virtual, telehealth. Contact information: [address]  [address] [place] [address] [phone]                 Next level of care provider has access to [facility] Link:no  Safety Planning and Suicide Prevention discussed: Yes,  Mother, Artemisa Sladek [phone]     Has patient been referred to the Quitline?: Patient does not use tobacco/nicotine products  Patient has been referred for addiction treatment: No known substance use disorder.  Jenkins LULLA Primer, LCSWA 02/11/2024, 8:30 AM

## 2024-02-11 NOTE — Plan of Care (Signed)
   Problem: Activity: Goal: Interest or engagement in activities will improve Outcome: Progressing Goal: Sleeping patterns will improve Outcome: Progressing   Problem: Coping: Goal: Ability to verbalize frustrations and anger appropriately will improve Outcome: Progressing Goal: Ability to demonstrate self-control will improve Outcome: Progressing   Problem: Health Behavior/Discharge Planning: Goal: Identification of resources available to assist in meeting health care needs will improve Outcome: Progressing Goal: Compliance with treatment plan for underlying cause of condition will improve Outcome: Progressing   Problem: Safety: Goal: Periods of time without injury will increase Outcome: Progressing

## 2024-03-22 ENCOUNTER — Ambulatory Visit

## 2024-04-12 ENCOUNTER — Other Ambulatory Visit (HOSPITAL_COMMUNITY): Payer: Self-pay
